# Patient Record
Sex: Male | Born: 1980 | Hispanic: Yes | Marital: Married | Smoking: Never smoker
Health system: Southern US, Community
[De-identification: ages and names within clinical notes are randomized; demographics above are authoritative.]

## PROBLEM LIST (undated history)

## (undated) DIAGNOSIS — K219 Gastro-esophageal reflux disease without esophagitis: Secondary | ICD-10-CM

## (undated) DIAGNOSIS — Z87828 Personal history of other (healed) physical injury and trauma: Secondary | ICD-10-CM

## (undated) DIAGNOSIS — E785 Hyperlipidemia, unspecified: Secondary | ICD-10-CM

## (undated) DIAGNOSIS — R7989 Other specified abnormal findings of blood chemistry: Secondary | ICD-10-CM

## (undated) DIAGNOSIS — E221 Hyperprolactinemia: Secondary | ICD-10-CM

## (undated) HISTORY — PX: WRIST SURGERY: SHX841

## (undated) HISTORY — DX: Hyperprolactinemia: E22.1

## (undated) HISTORY — DX: Hyperlipidemia, unspecified: E78.5

## (undated) HISTORY — DX: Personal history of other (healed) physical injury and trauma: Z87.828

## (undated) HISTORY — DX: Other specified abnormal findings of blood chemistry: R79.89

---

## 2013-10-14 ENCOUNTER — Ambulatory Visit: Payer: Self-pay

## 2013-10-14 ENCOUNTER — Ambulatory Visit: Payer: Self-pay | Admitting: Family Medicine

## 2013-10-14 VITALS — BP 124/90 | HR 75 | Temp 98.5°F | Resp 18 | Ht 66.0 in | Wt 144.0 lb

## 2013-10-14 DIAGNOSIS — R1031 Right lower quadrant pain: Secondary | ICD-10-CM

## 2013-10-14 DIAGNOSIS — K5900 Constipation, unspecified: Secondary | ICD-10-CM

## 2013-10-14 DIAGNOSIS — N50811 Right testicular pain: Secondary | ICD-10-CM

## 2013-10-14 DIAGNOSIS — N509 Disorder of male genital organs, unspecified: Secondary | ICD-10-CM

## 2013-10-14 LAB — POCT UA - MICROSCOPIC ONLY
BACTERIA, U MICROSCOPIC: NEGATIVE
CASTS, UR, LPF, POC: NEGATIVE
CRYSTALS, UR, HPF, POC: NEGATIVE
Epithelial cells, urine per micros: NEGATIVE
RBC, urine, microscopic: NEGATIVE
YEAST UA: NEGATIVE

## 2013-10-14 LAB — POCT CBC
Granulocyte percent: 62.5 %G (ref 37–80)
HCT, POC: 46.8 % (ref 43.5–53.7)
HEMOGLOBIN: 14.7 g/dL (ref 14.1–18.1)
Lymph, poc: 1.5 (ref 0.6–3.4)
MCH: 27.8 pg (ref 27–31.2)
MCHC: 31.4 g/dL — AB (ref 31.8–35.4)
MCV: 88.7 fL (ref 80–97)
MID (CBC): 0.4 (ref 0–0.9)
MPV: 9 fL (ref 0–99.8)
POC Granulocyte: 3.1 (ref 2–6.9)
POC LYMPH PERCENT: 30 %L (ref 10–50)
POC MID %: 7.5 %M (ref 0–12)
Platelet Count, POC: 229 10*3/uL (ref 142–424)
RBC: 5.28 M/uL (ref 4.69–6.13)
RDW, POC: 14.1 %
WBC: 4.9 10*3/uL (ref 4.6–10.2)

## 2013-10-14 LAB — POCT URINALYSIS DIPSTICK
Bilirubin, UA: NEGATIVE
GLUCOSE UA: NEGATIVE
Ketones, UA: NEGATIVE
Leukocytes, UA: NEGATIVE
NITRITE UA: NEGATIVE
RBC UA: NEGATIVE
UROBILINOGEN UA: 0.2
pH, UA: 5.5

## 2013-10-14 MED ORDER — TRAMADOL HCL 50 MG PO TABS: 50.0000 mg | ORAL_TABLET | Freq: Three times a day (TID) | ORAL | Status: DC | PRN

## 2013-10-14 NOTE — Progress Notes (Signed)
Subjective: For 3 months patient been having pain in his right testicle. A chest pain in the right lower quadrant of the abdomen it radiates down into the right testicle. 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 Knows of no specific injuries to his abdomen or testicle. He is Spanish-speaking but does very adequate in [place]. No nausea or vomiting. No urinary problems. No major pain with intercourse.  Objective: Healthy-appearing young man in no major distress. No CVA tenderness. Abdomen soft, normal bowel sounds, tender lower quadrant and about the McBurney's point area. Normal male scrotum with testes descended no tenderness. Negative for hernias.  Assessment: Right testicular pain Right lower quadrant abdominal pain  Plan: KUB, urinalysis, CBC  [facility] reading (PRIMARY) by  Dr. Linna Darner Normal xray except for a lot of stool in the colon  Results for orders placed in visit on 10/14/13  POCT CBC      Result Value Range   WBC 4.9  4.6 - 10.2 K/uL   Lymph, poc 1.5  0.6 - 3.4   POC LYMPH PERCENT 30.0  10 - 50 %L   MID (cbc) 0.4  0 - 0.9   POC MID % 7.5  0 - 12 %M   POC Granulocyte 3.1  2 - 6.9   Granulocyte percent 62.5  37 - 80 %G   RBC 5.28  4.69 - 6.13 M/uL   Hemoglobin 14.7  14.1 - 18.1 g/dL   HCT, POC 46.8  43.5 - 53.7 %   MCV 88.7  80 - 97 fL   MCH, POC 27.8  27 - 31.2 pg   MCHC 31.4 (*) 31.8 - 35.4 g/dL   RDW, POC 14.1     Platelet Count, POC 229  142 - 424 K/uL   MPV 9.0  0 - 99.8 fL  POCT UA - MICROSCOPIC ONLY      Result Value Range   WBC, Ur, HPF, POC 0-1     RBC, urine, microscopic neg     Bacteria, U Microscopic neg     Mucus, UA trace     Epithelial cells, urine per micros neg     Crystals, Ur, HPF, POC neg     Casts, Ur, LPF, POC neg     Yeast, UA neg    POCT URINALYSIS DIPSTICK      Result Value Range   Color, UA yellow     Clarity, UA clear     Glucose, UA neg     Bilirubin, UA neg     Ketones, UA neg     Spec Grav, UA >=1.030     Blood, UA neg     pH, UA 5.5     Protein, UA trace     Urobilinogen, UA 0.2     Nitrite, UA neg     Leukocytes, UA Negative     With normal labs and a lot of stool in the colon on the x-ray I do not find any specific cause. I think we will treat him with some laxatives and see how he does.  Fab helped explained things to patient in Spanish.  Marland Kitchen

## 2013-10-14 NOTE — Patient Instructions (Signed)
Drink plenty of fluids, especially water and juice like grape juice or apple juice or prune juice  Take MiraLax one dose daily until stools are loose, then decrease to every other day or as needed  If pain continues to persist please return and we will reevaluate and we may need to refer you to a urology specialist to evaluate the pain into the testicle  Return at any time if worse  Take one tramadol if needed for bad pain

## 2014-12-14 ENCOUNTER — Emergency Department
Admission: EM | Admit: 2014-12-14 | Discharge: 2014-12-14 | Disposition: A | Discharge status: Home / Self Care | Payer: Self-pay | Source: Home / Self Care | Attending: Emergency Medicine | Admitting: Emergency Medicine

## 2014-12-14 ENCOUNTER — Encounter: Payer: Self-pay | Admitting: *Deleted

## 2014-12-14 DIAGNOSIS — N342 Other urethritis: Secondary | ICD-10-CM

## 2014-12-14 DIAGNOSIS — N451 Epididymitis: Secondary | ICD-10-CM

## 2014-12-14 LAB — POCT URINALYSIS DIP (MANUAL ENTRY)
Bilirubin, UA: NEGATIVE
Blood, UA: NEGATIVE
Glucose, UA: NEGATIVE
Ketones, POC UA: NEGATIVE
Leukocytes, UA: NEGATIVE
Nitrite, UA: NEGATIVE
Protein Ur, POC: NEGATIVE
Spec Grav, UA: 1.025 (ref 1.005–1.03)
Urobilinogen, UA: 0.2 (ref 0–1)
pH, UA: 6 (ref 5–8)

## 2014-12-14 MED ORDER — METRONIDAZOLE 500 MG PO TABS: 500.0000 mg | ORAL_TABLET | Freq: Two times a day (BID) | ORAL | Status: DC

## 2014-12-14 MED ORDER — DOXYCYCLINE HYCLATE 100 MG PO CAPS: 100.0000 mg | ORAL_CAPSULE | Freq: Two times a day (BID) | ORAL | Status: DC

## 2014-12-14 MED ORDER — CEFTRIAXONE SODIUM 250 MG IJ SOLR
500.0000 mg | Freq: Once | INTRAMUSCULAR | Status: AC
  Administered 2014-12-14: 500 mg via INTRAMUSCULAR

## 2014-12-14 NOTE — ED Notes (Signed)
Pt c/o RLQ abd pain and RT testicle pain x 1 year.

## 2014-12-14 NOTE — ED Provider Notes (Signed)
CSN: [id number]     Arrival date & time 12/14/14  1948 History   First MD Initiated Contact with Patient 12/14/14 1950     Chief Complaint  Patient presents with  . Abdominal Pain   Presents to [facility] urgent care 7:45 PM HPI Here with fianc.. One year of mild right testicular pain. Nonspecific Chief complaint:  2 weeks of worsening right testicular pain with minimal swelling. Some dull mild suprapubic and right lower quadrant tenderness and pain. No nausea or vomiting. Appetite normal. No change of bowel habits. Also complains of reddened irritation of gums for several weeks without any bleeding or drainage. No sore throat. No other ENT symptoms. Complains of dysuria and urethral discharge for 2 weeks. No blood in urine. He states he is monogamous with his fiance. Denies ever having had an STD. He states last STD testing was about 2 years ago and was negative. He states he does not have a PCP. He speaks English fairly, but his fiance helps interpret for him at his request. Denies chest pain or shortness of breath or back pain. His fiance is being evaluated for possible STD as she has acute pelvic pain and vaginal discharge. History reviewed. No pertinent past medical history. History reviewed. No pertinent past surgical history. Family History  Problem Relation Age of Onset  . Hypertension Mother   . Diabetes Mother    History  Substance Use Topics  . Smoking status: Never Smoker   . Smokeless tobacco: Not on file  . Alcohol Use: No    Review of Systems  All other systems reviewed and are negative.   Allergies  Review of patient's allergies indicates no known allergies.  Home Medications   Prior to Admission medications   Medication Sig Start Date End Date Taking? Authorizing Provider  doxycycline (VIBRAMYCIN) 100 MG capsule Take 1 capsule (100 mg total) by mouth 2 (two) times daily. 12/14/14   Jacqulyn Cane, MD  metroNIDAZOLE (FLAGYL) 500 MG tablet Take 1 tablet  (500 mg total) by mouth 2 (two) times daily. For 10 days 12/14/14   Jacqulyn Cane, MD   BP 113/89 mmHg  Pulse 61  Temp(Src) 98.2 F (36.8 C) (Oral)  Resp 16  Ht 5\' 6"  (1.676 m)  Wt 160 lb (72.576 kg)  BMI 25.84 kg/m2  SpO2 99% Physical Exam  Constitutional: He is oriented to person, place, and time. He appears well-developed and well-nourished. No distress.  HENT:  Head: Normocephalic and atraumatic.  Some inflamed gingiva diffusely, but no evidence of dental abscess. No tenderness to percussion over teeth. No other oral lesions.  Eyes: Conjunctivae and EOM are normal. Pupils are equal, round, and reactive to light. No scleral icterus.  Neck: Normal range of motion.  Cardiovascular: Normal rate and normal heart sounds.   No murmur heard. Pulmonary/Chest: Effort normal and breath sounds normal. No respiratory distress.  Abdominal: Soft. He exhibits no distension and no mass. There is no hepatosplenomegaly. There is no rigidity, no rebound, no guarding, no CVA tenderness, no tenderness at McBurney's point and negative Murphy's sign. No hernia. Hernia confirmed negative in the right inguinal area and confirmed negative in the left inguinal area.  Very mild suprapubic and right lower quadrant tenderness.  Genitourinary: Left testis shows no swelling and no tenderness.  Right epididymis is mildly swollen and mildly indurated and tender.  Penis without lesions seen. There is a scant clearish yellow urethral discharge  Musculoskeletal: Normal range of motion.  Lymphadenopathy:    He has  no cervical adenopathy.       Right: No inguinal adenopathy present.       Left: No inguinal adenopathy present.  Neurological: He is alert and oriented to person, place, and time.  Skin: Skin is warm.  Psychiatric: He has a normal mood and affect.  Nursing note and vitals reviewed.  He declined rectal/prostate exam today.. ED Course  Procedures (including critical care time) Labs Review Labs Reviewed   GC/CHLAMYDIA PROBE AMP, URINE  HIV ANTIBODY (ROUTINE TESTING)  RPR  POCT URINALYSIS DIP (MANUAL ENTRY)   Results for orders placed or performed during the hospital encounter of 12/14/14  POCT urinalysis dipstick (new)  Result Value Ref Range   Color, UA yellow    Clarity, UA clear    Glucose, UA neg    Bilirubin, UA negative    Bilirubin, UA negative    Spec Grav, UA 1.025 1.005 - 1.03   Blood, UA negative    pH, UA 6.0 5 - 8   Protein Ur, POC negative    Urobilinogen, UA 0.2 0 - 1   Nitrite, UA Negative    Leukocytes, UA Negative      MDM   1. Urethritis   2. Right epididymitis    No evidence for acute abdomen. Based on the history and physical exam, he needs coverage for possible chlamydia, GC, and possible trichomonas. Treatment options discussed, as well as risks, benefits, alternatives. Patient voiced understanding and agreement with the following plans: Urine for GC and chlamydia. Blood test drawn for HIV and RPR. Rocephin 500 mg IM stat Discharge Medication List as of 12/14/2014  8:56 PM    START taking these medications   Details  doxycycline (VIBRAMYCIN) 100 MG capsule Take 1 capsule (100 mg total) by mouth 2 (two) times daily., Starting 12/14/2014, Until Discontinued, Normal    metroNIDAZOLE (FLAGYL) 500 MG tablet Take 1 tablet (500 mg total) by mouth 2 (two) times daily. For 10 days, Starting 12/14/2014, Until Discontinued, Normal       I urged him to establish with and follow up with primary care doctor within 7 days.  Precautions discussed. Red flags discussed.--Emergency room if any red flag Questions invited and answered. Patient voiced understanding and agreement.     Jacqulyn Cane, MD 12/14/14 2125

## 2014-12-15 LAB — GC/CHLAMYDIA PROBE AMP, URINE
Chlamydia, Swab/Urine, PCR: NEGATIVE
GC Probe Amp, Urine: NEGATIVE

## 2014-12-15 LAB — RPR

## 2014-12-15 LAB — HIV ANTIBODY (ROUTINE TESTING W REFLEX): HIV 1&2 Ab, 4th Generation: NONREACTIVE

## 2014-12-16 ENCOUNTER — Telehealth: Payer: Self-pay | Admitting: Emergency Medicine

## 2016-12-04 ENCOUNTER — Ambulatory Visit: Payer: Self-pay | Admitting: Physician Assistant

## 2017-07-03 ENCOUNTER — Ambulatory Visit (INDEPENDENT_AMBULATORY_CARE_PROVIDER_SITE_OTHER): Payer: Self-pay | Admitting: Physician Assistant

## 2017-07-03 ENCOUNTER — Encounter: Payer: Self-pay | Admitting: Physician Assistant

## 2017-07-03 VITALS — BP 115/80 | HR 56 | Resp 14 | Ht 66.0 in | Wt 157.0 lb

## 2017-07-03 DIAGNOSIS — R7989 Other specified abnormal findings of blood chemistry: Secondary | ICD-10-CM

## 2017-07-03 DIAGNOSIS — R6882 Decreased libido: Secondary | ICD-10-CM

## 2017-07-03 DIAGNOSIS — M25512 Pain in left shoulder: Secondary | ICD-10-CM

## 2017-07-03 DIAGNOSIS — R5383 Other fatigue: Secondary | ICD-10-CM

## 2017-07-03 DIAGNOSIS — Z7689 Persons encountering health services in other specified circumstances: Secondary | ICD-10-CM

## 2017-07-03 DIAGNOSIS — Z23 Encounter for immunization: Secondary | ICD-10-CM

## 2017-07-03 DIAGNOSIS — M25511 Pain in right shoulder: Secondary | ICD-10-CM

## 2017-07-03 DIAGNOSIS — M7711 Lateral epicondylitis, right elbow: Secondary | ICD-10-CM

## 2017-07-03 DIAGNOSIS — E221 Hyperprolactinemia: Secondary | ICD-10-CM

## 2017-07-03 DIAGNOSIS — Z833 Family history of diabetes mellitus: Secondary | ICD-10-CM

## 2017-07-03 DIAGNOSIS — R531 Weakness: Secondary | ICD-10-CM

## 2017-07-03 MED ORDER — DICLOFENAC SODIUM 1 % TD GEL: 2.0000 g | Freq: Four times a day (QID) | TRANSDERMAL | 11 refills | Status: AC

## 2017-07-03 NOTE — Patient Instructions (Signed)
Fatiga (Fatigue) La fatiga es una sensacin de cansancio en todo momento, falta de energa o falta de motivacin. La fatiga ocasional o leve con frecuencia es una reaccin normal a la actividad o la vida en general. Sin embargo, la fatiga de Engineer, site duracin (crnica) o extrema puede indicar una enfermedad preexistente. INSTRUCCIONES PARA EL CUIDADO EN EL HOGAR Controle su fatiga para ver si hay cambios. Las siguientes indicaciones ayudarn a Writer Ryder System pueda sentir:  Hable con el mdico acerca de cunto debe dormir cada noche. Trate de dormir la cantidad de tiempo requerida todas las noches.  Tome los medicamentos solamente como se lo haya indicado el mdico.  Siga una dieta saludable y nutritiva. Pida ayuda al mdico si necesita hacer cambios en su dieta.  Beba suficiente lquido para Consulting civil engineer orina clara o de color amarillo plido.  Practique actividades que lo relajen, como yoga, meditacin, terapia de [place] o acupuntura.  Haga ejercicios regularmente.  Cambie las situaciones que le provocan estrs. Trate de que su [place] y personal sea moderada.  No consuma drogas.  Limite el consumo de alcohol a no ms de 1 medida por da si es mujer y no est Music therapist, y 2 medidas si es hombre. Una medida equivale a 12onzas de cerveza, 5onzas de vino o 1onzas de bebidas alcohlicas de alta graduacin.  Tome una multivitamina, si se lo indic el mdico. SOLICITE ATENCIN MDICA SI:  La fatiga no mejora.  Tiene fiebre.  Tiene prdida o aumento involuntario de [place].  Tiene dolores de [place].  Tiene dificultad para: ? Dormirse. ? Dormir durante toda la noche.  Se siente enojado, culpable, ansioso o triste.  No puede defecar (estreimiento).  Tiene la piel seca.  Tiene hinchadas las piernas u otra parte del cuerpo. SOLICITE ATENCIN MDICA DE INMEDIATO SI:  Se siente confundido.  Tiene visin borrosa.  Sufre mareos o se desmaya.  Sufre un  dolor intenso de [place].  Siente dolor intenso en el abdomen, la pelvis o la espalda.  Tiene dolor de pecho, dificultad para respirar, o latidos cardacos irregulares o acelerados.  No puede orinar u [employer] de lo normal.  Presenta sangrado anormal, como sangrado del recto, la vagina, la nariz, los pulmones o los pezones.  Vomita sangre.  Tiene pensamientos acerca de hacerse dao a s mismo o cometer suicidio.  Le preocupa la posibilidad de hacerle dao a otra persona. Esta informacin no tiene Marine scientist el consejo del mdico. Asegrese de hacerle al mdico cualquier pregunta que tenga. Document Released: 12/27/2008 Document Revised: 01/02/2016 Document Reviewed: 01/12/2014   Codo de [employer] (Tennis Elbow) El codo de tenista es la hinchazn (inflamacin) de los tendones externos del antebrazo cerca del codo. Los tendones [employer] con los [place]. Esta afeccin puede presentarse si practica un deporte o realiza una tarea que exige el uso excesivo del codo. La causa del codo de [place] es la repeticin del mismo movimiento una y Elmon Kirschner. El codo de [place] puede causar lo siguiente:  Dolor y sensibilidad a la palpacin en el Management consultant y la parte externa del codo.  Sensacin de ardor que se extiende desde el codo Loss adjuster, chartered.  Agarre dbil de las manos. CUIDADOS EN EL HOGAR Actividad  Ponga el codo y Advertising copywriter en reposo como se lo haya indicado el mdico. Intente no realizar ninguna actividad que pueda haber causado el problema hasta que el mdico le permita reanudarla.  Si un fisioterapeuta le ensea ejercicios,  hgalos como se lo hayan indicado.  Si levanta un objeto, hgalo con la palma de la mano [place] arriba. [employer], se reduce el esfuerzo en el codo. Estilo de vida  Si el codo de [place] se debe a los deportes, revise el equipo y Chief Strategy Officer de lo siguiente: ? Que lo [place] correctamente. ? Que es apto para usted.  Si el codo de [place] se debe  al [place], tmese descansos con frecuencia, si es posible. Hable con el gerente respecto de hacer su trabajo de un modo que sea seguro para usted. ? Si el codo de [place] se debe al uso de la computadora, hable con el gerente [employer] cambios que pueden hacerse en su lugar de [place]. Instrucciones generales  Si se lo indican, aplique hielo sobre la zona dolorida: ? Field seismologist hielo en una bolsa plstica. ? Coloque una [employer] piel y la bolsa de hielo. ? Coloque el hielo durante 86minutos, 2 a 3veces por da.  Tome los medicamentos solamente como se lo haya indicado el mdico.  Si le aconsejaron usar un dispositivo ortopdico, selo como se lo haya indicado el mdico.  Concurra a todas las visitas de control como se lo haya indicado el mdico. Esto es importante. SOLICITE AYUDA SI:  El dolor no mejora con Dispensing optician.  El dolor [place].  Tiene debilidad en el antebrazo, la mano o los dedos de la [place].  No puede sentir el antebrazo, la mano o los dedos de la [place]. Esta informacin no tiene Marine scientist el consejo del mdico. Asegrese de hacerle al mdico cualquier pregunta que tenga. Document Released: 10/13/2010 Document Revised: 01/25/2015 Document Reviewed: 09/06/2014 Elsevier Interactive Patient Education  2018 [employer].  Chartered certified accountant Patient Education  [employer].

## 2017-07-03 NOTE — Progress Notes (Signed)
HPI:                                                                Blake Thornton is a 36 y.o. male who presents to [place]: Primary Care Sports Medicine today to establish care  Current concerns include: weakness and fatigue  Patient is Spanish speaking. His wife is acting as Astronomer. He has signed a waiver to a licensed interpreter.  Patient presents today with gradually worsening generalized weakness and fatigue. Onset 6 months ago, gradual. No precipitating events. 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 He denies fever, chills, unintended weight loss, cough, abdominal pain, change in bowel habits. Does endorse nausea, headaches, polydipsia, polyuria. Denies recent travel.   Past Medical History:  Diagnosis Date  . Hyperlipidemia    Past Surgical History:  Procedure Laterality Date  . WRIST SURGERY     Social History  Substance Use Topics  . Smoking status: Never Smoker  . Smokeless tobacco: Never Used  . Alcohol use No   family history includes Alcohol abuse in his father; Diabetes in his mother, sister, and sister; Hypertension in his mother, sister, and sister.  ROS: Review of Systems  Constitutional: Positive for diaphoresis. Negative for weight loss.  HENT: Negative.   Eyes: Positive for blurred vision.  Respiratory: Negative for cough and hemoptysis.   Cardiovascular: Positive for chest pain.  Gastrointestinal: Positive for nausea. Negative for blood in stool, constipation, diarrhea and melena.  Genitourinary: Negative for dysuria.       + nocturia  Musculoskeletal: Positive for joint pain (right elbow, bilateral shoulders) and myalgias.  Skin: Negative.   Neurological: Positive for weakness and headaches. Negative for dizziness, focal weakness and loss of consciousness.  Endo/Heme/Allergies: Positive for polydipsia.  Psychiatric/Behavioral: Negative for depression. The patient has insomnia.       Medications: Current Outpatient Prescriptions  Medication Sig Dispense Refill  . meloxicam (MOBIC) 7.5 MG tablet Take 7.5 mg by mouth daily.    Marland Kitchen lovastatin (MEVACOR) 10 MG tablet Take 10 mg by mouth daily.  2   No current facility-administered medications for this visit.    No Known Allergies   Objective:  BP 115/80   Pulse (!) 56   Resp 14   Ht 5\' 6"  (1.676 m)   Wt 157 lb (71.2 kg)   BMI 25.34 kg/m  Gen: well-groomed, cooperative, not ill-appearing, no distress HEENT: conjunctiva and cornea clear, EOMs intact, oropharynx clear, moist mucus membranes, neck supple, trachea midline, no thyromegaly or tenderness Pulm: Normal work of breathing, normal phonation, clear to auscultation bilaterally, no wheezes, rales or rhonchi CV: bradycardic, regular rhythm, s1 and s2 distinct, no murmurs, clicks or rubs  GI: abdomen soft, nondistended, nontender, no masses, no organomegaly Neuro: alert and oriented x 3, no tremor MSK: extremities atraumatic, normal gait and station; Shoulders: atraumatic, no visible deformity, no a/c joint tenderness, positive Hawkin's and lift-off bilaterally, full active ROM; Right elbow: atraumatic, no visible deformity, tenderness over the lateral epicondyle, strength and ROM intact Skin: warm, dry, intact; no rashes on exposed skin, no cyanosis  No flowsheet data found.   No results found for this or any previous visit (from the past 72 hour(s)). No results found.  Assessment and Plan: 36 y.o. male with   1. Encounter to establish care - reviewed PMH, PSH, PFH, medications and allergies - reviewed health maintenance - influenza given today  2. Family history of diabetes mellitus - Hemoglobin A1c  3. Fatigue, unspecified type - patient has some symptoms consistent with hypogonadism. Checking morning serum testosterone as well as assessing for thyroid disease, anemia, iron deficiency, and vitamin deficiencies - CBC - Comprehensive metabolic  panel - C-reactive protein - Ferritin - Sedimentation rate - TSH - Vitamin B12 - Vit D  25 hydroxy (rtn osteoporosis monitoring)  4. Lateral epicondylitis of right elbow - rehab exercises, topical anti-inflammatory, follow-up with Sports Medicine in 1-2 weeks - diclofenac sodium (VOLTAREN) 1 % GEL; Apply 2 g topically 4 (four) times daily. To affected joint.  Dispense: 100 g; Refill: 11  5. Acute pain of both shoulders   6. Low libido - Testosterone  7. Weakness generalized - Testosterone  8. Need for immunization against influenza - Flu Vaccine QUAD 36+ mos IM    Patient education and anticipatory guidance given Patient agrees with treatment plan Follow-up in 2 weeks or sooner as needed if symptoms worsen or fail to improve  Darlyne Russian PA-C

## 2017-07-04 ENCOUNTER — Ambulatory Visit (INDEPENDENT_AMBULATORY_CARE_PROVIDER_SITE_OTHER): Payer: Self-pay | Admitting: Sports Medicine

## 2017-07-04 ENCOUNTER — Encounter: Payer: Self-pay | Admitting: Sports Medicine

## 2017-07-04 ENCOUNTER — Ambulatory Visit (INDEPENDENT_AMBULATORY_CARE_PROVIDER_SITE_OTHER): Payer: Self-pay

## 2017-07-04 DIAGNOSIS — M25519 Pain in unspecified shoulder: Secondary | ICD-10-CM

## 2017-07-04 DIAGNOSIS — M542 Cervicalgia: Secondary | ICD-10-CM

## 2017-07-04 DIAGNOSIS — M25512 Pain in left shoulder: Secondary | ICD-10-CM

## 2017-07-04 DIAGNOSIS — M19011 Primary osteoarthritis, right shoulder: Secondary | ICD-10-CM | POA: Insufficient documentation

## 2017-07-04 DIAGNOSIS — M25511 Pain in right shoulder: Secondary | ICD-10-CM

## 2017-07-04 DIAGNOSIS — M19012 Primary osteoarthritis, left shoulder: Secondary | ICD-10-CM

## 2017-07-04 MED ORDER — PREDNISONE 50 MG PO TABS: ORAL_TABLET | ORAL | 0 refills | Status: DC

## 2017-07-04 MED ORDER — MELOXICAM 15 MG PO TABS: ORAL_TABLET | ORAL | 3 refills | Status: DC

## 2017-07-04 NOTE — Progress Notes (Signed)
   Subjective:    I'm seeing this patient as a consultation for:  Nelson Chimes, PA-C  CC: neck and shoulder pain  HPI: This is a pleasant and healthy 36 year old male, he works as a Theme park manager. For the past 2-3 months he's had pain that he localizes from his neck, radiating over the trapezius to the deltoids and all bleeding down both arms to his second and third fingers. He does describe numbness and tingling in his fingers. Pain is moderate, persistent.  Past medical history, Surgical history, Family history not pertinant except as noted below, Social history, Allergies, and medications have been entered into the medical record, reviewed, and no changes needed.   Review of Systems: No headache, visual changes, nausea, vomiting, diarrhea, constipation, dizziness, abdominal pain, skin rash, fevers, chills, night sweats, weight loss, swollen lymph nodes, body aches, joint swelling, muscle aches, chest pain, shortness of breath, mood changes, visual or auditory hallucinations.   Objective:   General: Well Developed, well nourished, and in no acute distress.  Neuro:  Extra-ocular muscles intact, able to move all 4 extremities, sensation grossly intact.  Deep tendon reflexes tested were normal. Psych: Alert and oriented, mood congruent with affect. ENT:  Ears and nose appear unremarkable.  Hearing grossly normal. Neck: Unremarkable overall appearance, trachea midline.  No visible thyroid enlargement. Eyes: Conjunctivae and lids appear unremarkable.  Pupils equal and round. Skin: Warm and dry, no rashes noted.  Cardiovascular: Pulses palpable, no extremity edema. Neck: Negative spurling's Full neck range of motion Grip strength and sensation normal in bilateral hands Strength good C4 to T1 distribution No sensory change to C4 to T1 Reflexes normal Bilateral shoulders: Inspection reveals no abnormalities, atrophy or asymmetry. Palpation is normal with no tenderness over AC joint or  bicipital groove. ROM is full in all planes. Rotator cuff strength normal throughout. Minimally positive Neer and Hawkin's tests, empty can. Speeds and Yergason's tests normal. No labral pathology noted with negative Obrien's, negative crank, negative clunk, and good stability. Normal scapular function observed. No painful arc and no drop arm sign. No apprehension sign  Impression and Recommendations:   This case required medical decision making of moderate complexity.  Neck and shoulder pain This does represent the great enigma of sports medicine, chicken or the egg, neck or shoulder. I do think this is cervical radiculitis, C7. Starting prednisone, x-rays of the neck and shoulders, meloxicam, formal physical therapy. Return to see me in 4-6 weeks.  ___________________________________________ Gwen Her. Dianah Field, M.D., ABFM., CAQSM. Primary Care and [place] Instructor of [place] [facility] of Medicine

## 2017-07-04 NOTE — Assessment & Plan Note (Signed)
This does represent the great enigma of sports medicine, chicken or the egg, neck or shoulder. I do think this is cervical radiculitis, C7. Starting prednisone, x-rays of the neck and shoulders, meloxicam, formal physical therapy. Return to see me in 4-6 weeks.

## 2017-07-05 DIAGNOSIS — R5383 Other fatigue: Secondary | ICD-10-CM | POA: Insufficient documentation

## 2017-07-05 DIAGNOSIS — R6882 Decreased libido: Secondary | ICD-10-CM | POA: Insufficient documentation

## 2017-07-05 DIAGNOSIS — M7711 Lateral epicondylitis, right elbow: Secondary | ICD-10-CM | POA: Insufficient documentation

## 2017-07-05 DIAGNOSIS — R531 Weakness: Secondary | ICD-10-CM | POA: Insufficient documentation

## 2017-07-05 DIAGNOSIS — Z833 Family history of diabetes mellitus: Secondary | ICD-10-CM | POA: Insufficient documentation

## 2017-07-05 LAB — COMPREHENSIVE METABOLIC PANEL
AG Ratio: 1.4 (calc) (ref 1.0–2.5)
ALBUMIN MSPROF: 4.2 g/dL (ref 3.6–5.1)
ALKALINE PHOSPHATASE (APISO): 70 U/L (ref 40–115)
ALT: 18 U/L (ref 9–46)
AST: 15 U/L (ref 10–40)
BUN: 20 mg/dL (ref 7–25)
CO2: 26 mmol/L (ref 20–32)
CREATININE: 0.7 mg/dL (ref 0.60–1.35)
Calcium: 8.9 mg/dL (ref 8.6–10.3)
Chloride: 105 mmol/L (ref 98–110)
Globulin: 3.1 g/dL (calc) (ref 1.9–3.7)
Glucose, Bld: 102 mg/dL — ABNORMAL HIGH (ref 65–99)
Potassium: 4.2 mmol/L (ref 3.5–5.3)
Sodium: 138 mmol/L (ref 135–146)
Total Bilirubin: 0.6 mg/dL (ref 0.2–1.2)
Total Protein: 7.3 g/dL (ref 6.1–8.1)

## 2017-07-05 LAB — CBC
HCT: 39.2 % (ref 38.5–50.0)
Hemoglobin: 13.3 g/dL (ref 13.2–17.1)
MCH: 27.9 pg (ref 27.0–33.0)
MCHC: 33.9 g/dL (ref 32.0–36.0)
MCV: 82.2 fL (ref 80.0–100.0)
MPV: 10.7 fL (ref 7.5–12.5)
PLATELETS: 229 10*3/uL (ref 140–400)
RBC: 4.77 10*6/uL (ref 4.20–5.80)
RDW: 13.3 % (ref 11.0–15.0)
WBC: 5.8 10*3/uL (ref 3.8–10.8)

## 2017-07-05 LAB — VITAMIN D 25 HYDROXY (VIT D DEFICIENCY, FRACTURES): Vit D, 25-Hydroxy: 28 ng/mL — ABNORMAL LOW (ref 30–100)

## 2017-07-05 LAB — HEMOGLOBIN A1C
EAG (MMOL/L): 6.6 (calc)
Hgb A1c MFr Bld: 5.8 % of total Hgb — ABNORMAL HIGH (ref <5.7)
Mean Plasma Glucose: 120 (calc)

## 2017-07-05 LAB — VITAMIN B12: Vitamin B-12: 388 pg/mL (ref 200–1100)

## 2017-07-05 LAB — FERRITIN: FERRITIN: 17 ng/mL — AB (ref 20–345)

## 2017-07-05 LAB — TSH: TSH: 1.27 mIU/L (ref 0.40–4.50)

## 2017-07-05 LAB — C-REACTIVE PROTEIN: CRP: 0.7 mg/L (ref <8.0)

## 2017-07-05 LAB — SEDIMENTATION RATE: SED RATE: 6 mm/h (ref 0–15)

## 2017-07-05 LAB — TESTOSTERONE: TESTOSTERONE: 213 ng/dL — AB (ref 250–827)

## 2017-07-05 NOTE — Progress Notes (Signed)
Good morning,  Your labs show a few findings that could explain your fatigue and weakness 1. Testosterone is low. This needs to be confirmed with another 8am blood draw. Testosterone can be replaced with an injection or topical gel 2. Iron stores (ferritin) are a little low. Recommend increasing iron-fortified and iron-rich foods in the diet 3. Vitamin D is also a little low. Recommend starting a daily vitamin D3 supplement 2000 units  Also, A1c is in the prediabetic range. This increases risk of developing diabetes. Recommend low carb diet. We need to monitor this every 6 months.  I will order your blood work to confirm the low testosterone diagnosis and would like you to schedule a follow-up appointment for 2-3 days after your labs are drawn.  Best, Evlyn Clines

## 2017-07-10 ENCOUNTER — Encounter: Payer: Self-pay | Admitting: Physician Assistant

## 2017-07-10 DIAGNOSIS — E221 Hyperprolactinemia: Secondary | ICD-10-CM | POA: Insufficient documentation

## 2017-07-10 DIAGNOSIS — R7989 Other specified abnormal findings of blood chemistry: Secondary | ICD-10-CM

## 2017-07-10 HISTORY — DX: Other specified abnormal findings of blood chemistry: R79.89

## 2017-07-10 HISTORY — DX: Hyperprolactinemia: E22.1

## 2017-07-10 LAB — TESTOSTERONE TOTAL,FREE,BIO, MALES
ALBUMIN MSPROF: 4.4 g/dL (ref 3.6–5.1)
Sex Hormone Binding: 10 nmol/L (ref 10–50)
Testosterone: 118 ng/dL — ABNORMAL LOW (ref 250–827)

## 2017-07-10 LAB — TEST AUTHORIZATION

## 2017-07-10 LAB — FSH/LH
FSH: 5.1 m[IU]/mL (ref 1.6–8.0)
LH: 2.4 m[IU]/mL (ref 1.5–9.3)

## 2017-07-10 LAB — PROLACTIN: Prolactin: 45.7 ng/mL — ABNORMAL HIGH (ref 2.0–18.0)

## 2017-07-10 LAB — PSA: PSA: 0.5 ng/mL (ref <=4.0)

## 2017-07-10 NOTE — Progress Notes (Signed)
Good morning,  Labs confirm low testosterone, also known as hypogonadism. This is an endocrine disorder that can have multiple causes. There is a gland inside the head called the pituitary gland and sometimes this is the source of the problem. To determine the cause, we need to get a brain MRI. You will be contacted soon to schedule this.   If you have any questions or concerns, I would be happy to discuss this with you in person.   Best, Evlyn Clines

## 2017-07-17 ENCOUNTER — Encounter: Payer: Self-pay | Admitting: Physician Assistant

## 2017-07-17 ENCOUNTER — Ambulatory Visit (INDEPENDENT_AMBULATORY_CARE_PROVIDER_SITE_OTHER): Payer: Self-pay | Admitting: Physician Assistant

## 2017-07-17 VITALS — BP 111/73 | HR 60 | Wt 152.0 lb

## 2017-07-17 DIAGNOSIS — E221 Hyperprolactinemia: Secondary | ICD-10-CM

## 2017-07-17 DIAGNOSIS — R7989 Other specified abnormal findings of blood chemistry: Secondary | ICD-10-CM

## 2017-07-17 DIAGNOSIS — N469 Male infertility, unspecified: Secondary | ICD-10-CM | POA: Insufficient documentation

## 2017-07-17 NOTE — Progress Notes (Signed)
HPI:                                                                Blake Thornton is a 36 y.o. male who presents to [facility] today for follow-up of fatigue/abnormal labs  Patient is Spanish speaking. Patient's wife is acting as Astronomer. He has signed the waiver declining a certified interpreter.  Patient presents today for follow-up of chronic fatigue, weakness, low libido and to review recent lab results. Blake Thornton was found to have very low serum testosterone (027,253) with normal FSH, LH and high prolactin (45.7). Patient's wife reports they have been trying to achieve pregnancy for 6 months without success. Wife also reports erectile dysfunction. She has had 3 pregnancies and 3 live births with a different partner in the past.  Past Medical History:  Diagnosis Date  . History of traumatic injury to musculoskeletal system    fall 18 feet  . Hyperlipidemia   . 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 Low serum testosterone 07/10/2017   Past Surgical History:  Procedure Laterality Date  . WRIST SURGERY     Social History  Substance Use Topics  . Smoking status: Never Smoker  . Smokeless tobacco: Never Used  . Alcohol use No   family history includes Alcohol abuse in his father; Diabetes in his mother, sister, and sister; Hypertension in his mother, sister, and sister.  ROS: negative except as noted in the HPI  Medications: Current Outpatient Prescriptions  Medication Sig Dispense Refill  . diclofenac sodium (VOLTAREN) 1 % GEL Apply 2 g topically 4 (four) times daily. To affected joint. 100 g 11  . lovastatin (MEVACOR) 10 MG tablet Take 10 mg by mouth daily.  2  . meloxicam (MOBIC) 15 MG tablet One tab PO qAM with breakfast for 2 weeks, then daily prn pain. 30 tablet 3   No current facility-administered medications for this visit.    No Known Allergies     Objective:  BP 111/73   Pulse 60   Wt 152 lb (68.9 kg)    BMI 24.53 kg/m  Gen:  alert, not ill-appearing, no distress, appropriate for age 89: head normocephalic without obvious abnormality, conjunctiva and cornea clear, trachea midline Pulm: Normal work of breathing, normal phonation Neuro: alert and oriented x 3, no tremor MSK: extremities atraumatic, normal gait and station Skin: intact, no rashes on exposed skin, no jaundice, no cyanosis   Depression screen PHQ 2/9 07/03/2017  Decreased Interest 0  Down, Depressed, Hopeless 0  PHQ - 2 Score 0     No results found for this or any previous visit (from the past 72 hour(s)). No results found.    Assessment and Plan: 36 y.o. 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 Low serum testosterone Lab Results  Component Value Date   TESTOSTERONE 118 (L) 07/08/2017  - this is a secondary hypogonadism of uncertain etiology. MR brain pending. Results were reviewed with patient and his wife. Questions were answered. Referring to reproductive endocrinology for further work-up and management  3. Hyperprolactinemia ([place]) - MR brain scheduled for 07/22/17 to rule out pituitary adenoma   Patient education and anticipatory guidance given Patient agrees with treatment plan Follow-up in  6 months for medication management or sooner as needed if symptoms worsen or fail to improve  Darlyne Russian PA-C

## 2017-07-22 ENCOUNTER — Ambulatory Visit (INDEPENDENT_AMBULATORY_CARE_PROVIDER_SITE_OTHER): Payer: Self-pay

## 2017-07-22 DIAGNOSIS — E230 Hypopituitarism: Secondary | ICD-10-CM

## 2017-07-22 DIAGNOSIS — R7989 Other specified abnormal findings of blood chemistry: Secondary | ICD-10-CM

## 2017-07-22 DIAGNOSIS — D352 Benign neoplasm of pituitary gland: Secondary | ICD-10-CM | POA: Insufficient documentation

## 2017-07-22 DIAGNOSIS — E221 Hyperprolactinemia: Secondary | ICD-10-CM

## 2017-07-22 DIAGNOSIS — E229 Hyperfunction of pituitary gland, unspecified: Principal | ICD-10-CM

## 2017-07-22 MED ORDER — GADOBENATE DIMEGLUMINE 529 MG/ML IV SOLN
10.0000 mL | Freq: Once | INTRAVENOUS | Status: AC | PRN
  Administered 2017-07-22: 10 mL via INTRAVENOUS

## 2017-07-22 NOTE — Progress Notes (Signed)
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 This is the cause of the low testosterone and infertility. This is treatable with medicines.  In addition to following up with [facility], who will treat the infertility, I am going to refer you to an Endocrinologist to treat the microadenoma.  Best, Evlyn Clines

## 2017-08-01 ENCOUNTER — Ambulatory Visit (INDEPENDENT_AMBULATORY_CARE_PROVIDER_SITE_OTHER): Payer: Self-pay | Admitting: Sports Medicine

## 2017-08-01 ENCOUNTER — Encounter: Payer: Self-pay | Admitting: Sports Medicine

## 2017-08-01 ENCOUNTER — Encounter: Payer: Self-pay | Admitting: Endocrinology

## 2017-08-01 ENCOUNTER — Ambulatory Visit (INDEPENDENT_AMBULATORY_CARE_PROVIDER_SITE_OTHER): Payer: Self-pay | Admitting: Endocrinology

## 2017-08-01 VITALS — BP 112/70 | HR 75 | Wt 155.4 lb

## 2017-08-01 DIAGNOSIS — M19012 Primary osteoarthritis, left shoulder: Secondary | ICD-10-CM

## 2017-08-01 DIAGNOSIS — M19011 Primary osteoarthritis, right shoulder: Secondary | ICD-10-CM

## 2017-08-01 DIAGNOSIS — D352 Benign neoplasm of pituitary gland: Secondary | ICD-10-CM

## 2017-08-01 DIAGNOSIS — E229 Hyperfunction of pituitary gland, unspecified: Secondary | ICD-10-CM

## 2017-08-01 MED ORDER — BROMOCRIPTINE MESYLATE 2.5 MG PO TABS: 2.5000 mg | ORAL_TABLET | Freq: Every day | ORAL | 5 refills | Status: DC

## 2017-08-01 NOTE — Progress Notes (Signed)
Subjective:    Patient ID: Blake Thornton, male    DOB: 1981/02/27, 36 y.o.   MRN: [id number]  HPI Pt is referred by Patriciaann Clan, PA, for hyperprolactinemia and hypogonadism.  Wife translates.  Pt reports he had puberty at age 50.  He has no biological children.  He and wife have been trying to conceive a child x 6 months, without success.  Pt's wife has 3 children from a previous marriage.  Pt's former girlfriend suffered a miscarriage.  He says he has never taken illicit androgens.  He has never been on any prescribed medication for hypogonadism.  He does not take antiandrogens or opioids.  He denies any h/o XRT, or genital infection.  He had a serious head injury in 2010.  He says he was in [facility] then.  No h/o injury of serious injury to the genital area.  He has no h/o sleep apnea or DVT.   He does not consume alcohol excessively.  He has slight headache, and assoc dizziness.  Past Medical History:  Diagnosis Date  . History of traumatic injury to musculoskeletal system    fall 18 feet  . Hyperlipidemia   . 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 Low serum testosterone 07/10/2017    Past Surgical History:  Procedure Laterality Date  . WRIST SURGERY      Social History   Socioeconomic History  . Marital status: Married    Spouse name: Not on file  . Number of children: Not on file  . Years of education: Not on file  . Highest education level: Not on file  Social Needs  . Financial resource strain: Not on file  . Food insecurity - worry: Not on file  . Food insecurity - inability: Not on file  . Transportation needs - medical: Not on file  . Transportation needs - non-medical: Not on file  Occupational History  . Not on file  Tobacco Use  . Smoking status: Never Smoker  . Smokeless tobacco: Never Used  Substance and Sexual Activity  . Alcohol use: No  . Drug use: No  . Sexual activity: Yes  Other Topics Concern  . Not on file  Social History  Narrative  . Not on file    Current Outpatient Medications on File Prior to Visit  Medication Sig Dispense Refill  . diclofenac sodium (VOLTAREN) 1 % GEL Apply 2 g topically 4 (four) times daily. To affected joint. 100 g 11  . lovastatin (MEVACOR) 10 MG tablet Take 10 mg by mouth daily.  2  . meloxicam (MOBIC) 15 MG tablet One tab PO qAM with breakfast for 2 weeks, then daily prn pain. 30 tablet 3  . [DISCONTINUED] diphenhydrAMINE (BENADRYL) 25 mg capsule Take 25 mg by mouth every 6 (six) hours as needed.     No current facility-administered medications on file prior to visit.     No Known Allergies  Family History  Problem Relation Age of Onset  . Hypertension Mother   . Diabetes Mother   . Alcohol abuse Father   . Hypertension Sister   . Diabetes Sister   . Hypertension Sister   . Diabetes Sister   . Other Neg Hx        hyperprolactinemia    BP 112/70 (BP Location: Left Arm, Patient Position: Sitting, Cuff Size: Normal)   Pulse 75   Wt 155 lb 6.4 oz (70.5 kg)   SpO2 98%   BMI 25.08 kg/m  Review of Systems denies depression, numbness, weight change, difficulty with urination, gynecomastia, fever, easy bruising, sob, rash, rhinorrhea, or leg edema.  He has nausea, ED sxs, intermitt diplopia, and generalized weakness.       Objective:   Physical Exam VS: see vs page GEN: no distress HEAD: head: no deformity eyes: no periorbital swelling, no proptosis external nose and ears are normal mouth: no lesion seen NECK: supple, thyroid is not enlarged CHEST WALL: no deformity LUNGS: clear to auscultation BREASTS:  No gynecomastia CV: reg rate and rhythm, no murmur ABD: abdomen is soft, nontender.  no hepatosplenomegaly.  not distended.  no hernia GENITALIA:  Normal male.   MUSCULOSKELETAL: muscle bulk and strength are grossly normal.  no obvious joint swelling.  gait is normal and steady EXTEMITIES: no leg edema PULSES: no carotid bruit NEURO:  cn 2-12 grossly  intact.   readily moves all 4's.  sensation is intact to touch on all 4's SKIN:  Normal texture and temperature.  No rash or suspicious lesion is visible.  Normal hair distribution NODES:  None palpable at the neck.   PSYCH: alert, well-oriented.  Does not appear anxious nor depressed.   Prolactin=46  Lab Results  Component Value Date   TSH 1.27 07/04/2017    Lab Results  Component Value Date   TESTOSTERONE 118 (L) 07/08/2017   MRI: normal appearance the brain itself. 4 mm hypoenhancing focus in the right side of the gland consistent with pituitary microadenoma.    Assessment & Plan:  Hyperprolactinemia, new, uncertain etiology Hypogonadism, uncertain relationship to the above Pituitary microadenoma: uncertain relationship to the above.   Patient Instructions  Please start taking "bromocriptine," to bring down the prolactin. It has possible side effects of nausea and dizziness.  These go away with time.  You can avoid these by taking it at bedtime, and by taking just take 1/2 pill for the first week. This will increase your chances of fathering a child.     Please come back for a follow-up appointment in 1 month. Our plan will be to see if this gets the prolactin down to normal.   The next step will be to see if this gets the testosterone back to normal.

## 2017-08-01 NOTE — Patient Instructions (Addendum)
Please start taking "bromocriptine," to bring down the prolactin. It has possible side effects of nausea and dizziness.  These go away with time.  You can avoid these by taking it at bedtime, and by taking just take 1/2 pill for the first week. This will increase your chances of fathering a child.     Please come back for a follow-up appointment in 1 month. Our plan will be to see if this gets the prolactin down to normal.   The next step will be to see if this gets the testosterone back to normal.

## 2017-08-01 NOTE — Assessment & Plan Note (Signed)
X-rays confirm bilateral glenohumeral osteoarthritis. Bilateral injections placed into the shoulder joints, he will go to physical therapy and make his first appointment. Return to see me in 1 month.

## 2017-08-01 NOTE — Progress Notes (Signed)
  Subjective:    CC: Follow-up  HPI: Bilateral shoulder pain: With somewhat of a unclear etiology at the initial visit, neck versus shoulders, shoulder x-rays did show bilateral glenohumeral osteoarthritis.  He did not do any physical therapy, use a little bit of a topical NSAID that has been minimally effective.  Pain is moderate, persistent, localized without radiation.  Past medical history:  Negative.  See flowsheet/record as well for more information.  Surgical history: Negative.  See flowsheet/record as well for more information.  Family history: Negative.  See flowsheet/record as well for more information.  Social history: Negative.  See flowsheet/record as well for more information.  Allergies, and medications have been entered into the medical record, reviewed, and no changes needed.   Review of Systems: No fevers, chills, night sweats, weight loss, chest pain, or shortness of breath.   Objective:    General: Well Developed, well nourished, and in no acute distress.  Neuro: Alert and oriented x3, extra-ocular muscles intact, sensation grossly intact.  HEENT: Normocephalic, atraumatic, pupils equal round reactive to light, neck supple, no masses, no lymphadenopathy, thyroid nonpalpable.  Skin: Warm and dry, no rashes. Cardiac: Regular rate and rhythm, no murmurs rubs or gallops, no lower extremity edema.  Respiratory: Clear to auscultation bilaterally. Not using accessory muscles, speaking in full sentences.  Procedure: Real-time Ultrasound Guided Injection of left glenohumeral joint Device: GE Logiq E  Verbal informed consent obtained.  Time-out conducted.  Noted no overlying erythema, induration, or other signs of local infection.  Skin prepped in a sterile fashion.  Local anesthesia: Topical Ethyl chloride.  With sterile technique and under real time ultrasound guidance: Using a 22-gauge spinal needle advanced into the clinic humeral joint from a posterior approach, I then  injected 1 cc kenalog 40, 2 cc lidocaine, 2 cc bupivacaine. Completed without difficulty  Pain immediately resolved suggesting accurate placement of the medication.  Advised to call if fevers/chills, erythema, induration, drainage, or persistent bleeding.  Images permanently stored and available for review in the ultrasound unit.  Impression: Technically successful ultrasound guided injection.  Procedure: Real-time Ultrasound Guided Injection of right glenohumeral joint Device: GE Logiq E  Verbal informed consent obtained.  Time-out conducted.  Noted no overlying erythema, induration, or other signs of local infection.  Skin prepped in a sterile fashion.  Local anesthesia: Topical Ethyl chloride.  With sterile technique and under real time ultrasound guidance: Using a 22-gauge spinal needle advanced into the clinic humeral joint from a posterior approach, I then injected 1 cc kenalog 40, 2 cc lidocaine, 2 cc bupivacaine. Completed without difficulty  Pain immediately resolved suggesting accurate placement of the medication.  Advised to call if fevers/chills, erythema, induration, drainage, or persistent bleeding.  Images permanently stored and available for review in the ultrasound unit.  Impression: Technically successful ultrasound guided injection.  Impression and Recommendations:    Primary osteoarthritis of both shoulders X-rays confirm bilateral glenohumeral osteoarthritis. Bilateral injections placed into the shoulder joints, he will go to physical therapy and make his first appointment. Return to see me in 1 month.  ___________________________________________ Gwen Her. Dianah Field, M.D., ABFM., CAQSM. Primary Care and [place] Instructor of [employer] [facility] [employer]

## 2017-08-06 ENCOUNTER — Ambulatory Visit: Payer: Self-pay | Admitting: Rehabilitative and Restorative Service Providers"

## 2017-08-12 ENCOUNTER — Ambulatory Visit (INDEPENDENT_AMBULATORY_CARE_PROVIDER_SITE_OTHER): Payer: Self-pay | Admitting: Rehabilitative and Restorative Service Providers"

## 2017-08-12 DIAGNOSIS — M25511 Pain in right shoulder: Secondary | ICD-10-CM

## 2017-08-12 DIAGNOSIS — R293 Abnormal posture: Secondary | ICD-10-CM

## 2017-08-12 DIAGNOSIS — M25512 Pain in left shoulder: Secondary | ICD-10-CM

## 2017-08-12 DIAGNOSIS — M542 Cervicalgia: Secondary | ICD-10-CM

## 2017-08-12 DIAGNOSIS — R29898 Other symptoms and signs involving the musculoskeletal system: Secondary | ICD-10-CM

## 2017-08-12 DIAGNOSIS — G8929 Other chronic pain: Secondary | ICD-10-CM

## 2017-08-12 NOTE — Therapy (Signed)
[facility]  [place], [place], [address] Phone: [phone]   Fax:  [phone]  Physical Therapy Evaluation  Patient Details  Name: Blake Thornton MRN: [id number] Date of Birth: 1980/12/19 Referring Provider: Dr Dianah Field   Encounter Date: 08/12/2017  PT End of Session - 08/12/17 1140    Visit Number  1    Number of Visits  12    Date for PT Re-Evaluation  09/23/17    PT Start Time  3500    PT Stop Time  1245    PT Time Calculation (min)  61 min    Activity Tolerance  Patient tolerated treatment well       Past Medical History:  Diagnosis Date  . History of traumatic injury to musculoskeletal system    fall 18 feet  . Hyperlipidemia   . 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 Low serum testosterone 07/10/2017    Past Surgical History:  Procedure Laterality Date  . WRIST SURGERY      There were no vitals filed for this visit.   Subjective Assessment - 08/12/17 1155    Subjective  Having a lot of shoulder and neck pain which was diagnosed with arthritis with symptoms present over the past 6 months. There was no known injury.     Patient is accompained by:  Family member;Interpreter    Pertinent History  Rt shoulder pain following accident ~ 8 years ago and has had intermittent pain since that time - patient fell 16 ft on concrete Lt arm and wrist then onto his forehead and Rt arm - fx Lt wrist and dislocation Rt shoulder hospitalized for ~ 1 week; denies any other medical problems; treatment for tumor on pitutary gland     How long can you sit comfortably?  5-6 min     How long can you stand comfortably?  30 min     How long can you walk comfortably?  5-6 min     Diagnostic tests  xrays show arthritis     Patient Stated Goals  feel functional again     Currently in Pain?  Yes    Pain Score  6     Pain Location  Shoulder    Pain Orientation  Right    Pain Descriptors / Indicators  Throbbing     Pain Type  Chronic pain    Pain Radiating Towards  Rt arm to wrist     Pain Onset  More than a month ago    Pain Frequency  Constant    Aggravating Factors   working - after work it is worse     Pain Relieving Factors  putting arm in sling; leaving it alone     Multiple Pain Sites  Yes    Pain Score  3    Pain Location  Shoulder    Pain Orientation  Left    Pain Descriptors / Indicators  Tiring;Throbbing    Pain Type  Chronic pain    Pain Radiating Towards  down arm to wrist     Pain Onset  More than a month ago    Pain Frequency  Constant    Aggravating Factors   working     Pain Relieving Factors  not using arm          [facility] PT Assessment - 08/12/17 0001      Assessment   Medical Diagnosis  Neck and bilateral shoudler pain     Referring Provider  Dr Dianah Field    Onset Date/Surgical Date  01/22/17 some symptoms present for the past 8 years     Hand Dominance  Right    Next MD Visit  08/29/17    Prior Therapy  none       Precautions   Precautions  None      Balance Screen   Has the patient fallen in the past 6 months  Yes    How many times?  1 no injury     Has the patient had a decrease in activity level because of a fear of falling?   No    Is the patient reluctant to leave their home because of a fear of falling?   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; sometimes with assist up to 800 pounds; bent forward much of the day; climbing ladder - 12-13 hours/day 5-7 days a week - 15 years     Leisure  sedentary       Observation/Other Assessments   Focus on Therapeutic Outcomes (FOTO)   55% limitation      Sensation   Additional Comments  tingling into the Rt/Lt long/ring/little fingers on an intermittent basis       Posture/Postural Control   Posture Comments  significant head forward posture; incresaed  thoracic kyphosis; scapyulae abducted and rotated along the thoracic wall; head of the humerus anterior in orientation       AROM   Right/Left Shoulder  -- pain with all Rt shoulder motions     Right Shoulder Extension  51 Degrees    Right Shoulder Flexion  72 Degrees    Right Shoulder ABduction  128 Degrees    Right Shoulder Internal Rotation  40 Degrees    Right Shoulder External Rotation  52 Degrees    Left Shoulder Extension  57 Degrees    Left Shoulder Flexion  148 Degrees    Left Shoulder ABduction  149 Degrees    Left Shoulder Internal Rotation  38 Degrees    Left Shoulder External Rotation  53 Degrees    Cervical Flexion  38    Cervical Extension  30    Cervical - Right Side Bend  35    Cervical - Left Side Bend  31    Cervical - Right Rotation  65    Cervical - Left Rotation  68      Strength   Overall Strength Comments  moves in all planes against gravity       Palpation   Spinal mobility  hypomobile cervical and thoracic spine with CPA and lateral mobs     Palpation comment  significant muscular tightness pecs; ant/lat/post cervical musculature; upper traps; teres             Objective measurements completed on examination: See above findings.      [facility] Adult PT Treatment/Exercise - 08/12/17 0001      Therapeutic Activites    Therapeutic Activities  -- myofacial ball release work       Neuro Re-ed    Neuro Re-ed Details   postural correction encouraging posterior shoulder girdle engagement      Shoulder Exercises: Standing   Other Standing Exercises  scap squeeze with noodle 10 sec x 5; axial extension 10 sec x 5; L's; W's  Shoulder Exercises: Stretch   Other Shoulder Stretches  3 way doorway 20-30 sec x 1 each position to pt tolerance vc for position and to avoid bouncing     Other Shoulder Stretches  prolonged snow angel supine ~ 2-3 min arms at ~ 65 deg       Electrical Stimulation   Electrical Stimulation Action  trial of TENS unit ~ 5 min        Manual Therapy   Manual therapy comments  pt supine     Joint Mobilization  gentle CPA mobs cervical and thoracic spine     Soft tissue mobilization  cervical     Myofascial Release  sternum              PT Education - 08/12/17 1242    Education provided  Yes    Education Details  postural correction; HEP; myofacial ball release work ; Engineer, building services) Educated  Patient;Spouse    Methods  Explanation;Demonstration;Tactile cues;Verbal cues;Handout    Comprehension  Verbalized understanding;Returned demonstration;Verbal cues required;Tactile cues required          PT Long Term Goals - 08/12/17 1400      PT LONG TERM GOAL #1   Title  Improve posture and alignment with patient to demonstrated upright posture with posterior shoulder girdle musculature engaged 09/23/17    Time  6    Period  Weeks    Status  New      PT LONG TERM GOAL #2   Title  Increase cervical and bilat shoulder ROM by 5-15 degrees throughout 09/23/17    Time  6    Period  Weeks    Status  New      PT LONG TERM GOAL #3   Title  Decrease pain by 50-75% allowing patient to sleep for 4-5 hours without awakening due to pain 09/23/17    Time  6    Period  Weeks    Status  New      PT LONG TERM GOAL #4   Title  Independent in HEP 09/23/17    Time  6    Period  Weeks    Status  New      PT LONG TERM GOAL #5   Title  Improve FOTO to </= 55% limitation 09/23/17    Time  6    Period  Weeks    Status  New             Plan - 08/12/17 1300    Clinical Impression Statement  Blake Thornton presents with chronic recurrent Rt > Lt shoulder pain with initial injury ~ 8 years ago due to 16 ft fall from roof. He has had incresaed pian in the neck and shoulders for the past 6 months. Patient presents with very poor posture and alignment; limited cervical and thoracic mobiilty; decreased bilat shoulder ROM; pain with AROM bilat shoulders; decreased functional activity level; pain limiting ability to sleep.  Patient will benefit from PT to address problems identified.     History and Personal Factors relevant to plan of care:  Lx Lt wrist; bilat shoulder injuries from fall ~8 years ago    Clinical Presentation  Evolving    Clinical Decision Making  Low    Rehab Potential  Good    PT Frequency  2x / week    PT Duration  6 weeks    PT Treatment/Interventions  Patient/family education;ADLs/Self Care Home Management;Cryotherapy;Electrical Stimulation;Iontophoresis 4mg /ml Dexamethasone;Moist Heat;Ultrasound;Dry needling;Manual  techniques;Neuromuscular re-education;Therapeutic activities;Therapeutic exercise    PT Next Visit Plan  review HEP; manual work to release tightness anterior shoulder/pecs/upper trap/cervical musculature; neuromuscular re-education working on posture and alignment; modalities as indicated     Consulted and Agree with Plan of Care  Patient       Patient will benefit from skilled therapeutic intervention in order to improve the following deficits and impairments:  Postural dysfunction, Improper body mechanics, Impaired UE functional use, Increased muscle spasms, Increased fascial restricitons, Decreased range of motion, Decreased strength, Pain, Decreased activity tolerance, Decreased mobility  Visit Diagnosis: Chronic right shoulder pain - Plan: PT plan of care cert/re-cert  Chronic left shoulder pain - Plan: PT plan of care cert/re-cert  Cervicalgia - Plan: PT plan of care cert/re-cert  Other symptoms and signs involving the musculoskeletal system - Plan: PT plan of care cert/re-cert  Abnormal posture - Plan: PT plan of care cert/re-cert     Problem List Patient Active Problem List   Diagnosis Date Noted  . 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 Low serum testosterone 07/10/2017  . Family history of diabetes mellitus 07/05/2017  . Fatigue 07/05/2017  . Lateral epicondylitis of right elbow 07/05/2017  . Low  libido 07/05/2017  . Weakness generalized 07/05/2017  . Primary osteoarthritis of both shoulders 07/04/2017    Maghan Jessee Nilda Simmer PT, MPH  08/12/2017, 2:08 PM  [facility] [place] [place], [place], [address] Phone: [phone]   Fax:  [phone]  Name: Blake Thornton MRN: [id number] Date of Birth: [birth year]

## 2017-08-12 NOTE — Patient Instructions (Addendum)
SUPINE Tips A    Being in the supine position means to be lying on the back. Lying on the back is the position of least compression on the bones and discs of the spine, and helps to re-align the natural curves of the back. 2-5 min can bend elbows to release stretch     Axial Extension (Chin Tuck)    Pull chin in and lengthen back of neck. Hold __5__ seconds while counting out loud. Repeat __10__ times. Do __several__ sessions per day.  Shoulder Blade Squeeze    Rotate shoulders back, then squeeze shoulder blades down and back  Hold 10 sec Repeat _10___ times. Do __several __ sessions per day.  Upper Back Strength: Lower Trapezius / Rotator Cuff " L's "     Arms in waitress pose, palms up. Press hands back and slide shoulder blades down. Hold for __5__ seconds. Repeat _10___ times. 1-2 times per day.    Scapular Retraction: Elbow Flexion (Standing)  "W's"     With elbows bent to 90, pinch shoulder blades together and rotate arms out, keeping elbows bent. Repeat __10__ times per set. Do __1-2__ sets per session. Do _several ___ sessions per day.  Scapula Adduction With Pectoralis Stretch: Low - Standing   Shoulders at 45 hands even with shoulders, keeping weight through legs, shift weight forward until you feel pull or stretch through the front of your chest. Hold _30__ seconds. Do _3__ times, _2-4__ times per day.   Scapula Adduction With Pectoralis Stretch: Mid-Range - Standing   Shoulders at 90 elbows even with shoulders, keeping weight through legs, shift weight forward until you feel pull or strength through the front of your chest. Hold __30_ seconds. Do _3__ times, __2-4_ times per day.   Scapula Adduction With Pectoralis Stretch: High - Standing   Shoulders at 120 hands up high on the doorway, keeping weight on feet, shift weight forward until you feel pull or stretch through the front of your chest. Hold _30__ seconds. Do _3__ times, _2-3__ times per  day.   TENS UNIT: This is helpful for muscle pain and spasm.   Search and Purchase a TENS 7000 2nd edition at www.tenspros.com. It should be less than $30.     TENS unit instructions: Do not shower or bathe with the unit on Turn the unit off before removing electrodes or batteries If the electrodes lose stickiness add a drop of water to the electrodes after they are disconnected from the unit and place on plastic sheet. If you continued to have difficulty, call the TENS unit company to purchase more electrodes. Do not apply lotion on the skin area prior to use. Make sure the skin is clean and dry as this will help prolong the life of the electrodes. After use, always check skin for unusual red areas, rash or other skin difficulties. If there are any skin problems, does not apply electrodes to the same area. Never remove the electrodes from the unit by pulling the wires. Do not use the TENS unit or electrodes other than as directed. Do not change electrode placement without consultating your therapist or physician. Keep 2 fingers with between each electrode.     [facility] [place] [address] [place], [place] [address]  [phone] (office) [phone] (fax)

## 2017-08-22 ENCOUNTER — Encounter: Payer: Self-pay | Admitting: Rehabilitative and Restorative Service Providers"

## 2017-08-22 ENCOUNTER — Ambulatory Visit: Payer: Self-pay | Admitting: Rehabilitative and Restorative Service Providers"

## 2017-08-22 DIAGNOSIS — R293 Abnormal posture: Secondary | ICD-10-CM

## 2017-08-22 DIAGNOSIS — M542 Cervicalgia: Secondary | ICD-10-CM

## 2017-08-22 DIAGNOSIS — M25511 Pain in right shoulder: Secondary | ICD-10-CM

## 2017-08-22 DIAGNOSIS — R29898 Other symptoms and signs involving the musculoskeletal system: Secondary | ICD-10-CM

## 2017-08-22 DIAGNOSIS — G8929 Other chronic pain: Secondary | ICD-10-CM

## 2017-08-22 DIAGNOSIS — M25512 Pain in left shoulder: Secondary | ICD-10-CM

## 2017-08-22 NOTE — Patient Instructions (Addendum)
Thoracic Lift    Press shoulders down. Then lift mid-thoracic spine (area between the shoulder blades). Lift the breastbone slightly. Hold ___ seconds. Relax. Repeat ___ times.  Copyright  VHI. All rights reserved.  Resisted External Rotation: in Neutral - Bilateral    Sit or stand, tubing in both hands, elbows at sides, bent to 90, forearms forward. Pinch shoulder blades together and tense band without moving the band out. Keep elbows at sides. Repeat __10__ times per set. Do _2-3___ sets per session. Do _2-3___ sessions per day.  ELBOW: Biceps - Standing    Standing in doorway, place one hand on wall, elbow straight. Lean forward. Hold _20-30__ seconds. _3__ reps per set, _2-3__ sets per day   Trigger Point Dry Needling  . What is Trigger Point Dry Needling (DN)? o DN is a physical therapy technique used to treat muscle pain and dysfunction. Specifically, DN helps deactivate muscle trigger points (muscle knots).  o A thin filiform needle is used to penetrate the skin and stimulate the underlying trigger point. The goal is for a local twitch response (LTR) to occur and for the trigger point to relax. No medication of any kind is injected during the procedure.   . What Does Trigger Point Dry Needling Feel Like?  o The procedure feels different for each individual patient. Some patients report that they do not actually feel the needle enter the skin and overall the process is not painful. Very mild bleeding may occur. However, many patients feel a deep cramping in the muscle in which the needle was inserted. This is the local twitch response.   Marland Kitchen How Will I feel after the treatment? o Soreness is normal, and the onset of soreness may not occur for a few hours. Typically this soreness does not last longer than two days.  o Bruising is uncommon, however; ice can be used to decrease any possible bruising.  o In rare cases feeling tired or nauseous after the treatment is normal. In  addition, your symptoms may get worse before they get better, this period will typically not last longer than 24 hours.   . What Can I do After My Treatment? o Increase your hydration by drinking more water for the next 24 hours. o You may place ice or heat on the areas treated that have become sore, however, do not use heat on inflamed or bruised areas. Heat often brings more relief post needling. o You can continue your regular activities, but vigorous activity is not recommended initially after the treatment for 24 hours. o DN is best combined with other physical therapy such as strengthening, stretching, and other therapies.

## 2017-08-22 NOTE — Therapy (Addendum)
[place], [place], [address] Phone: [phone]   Fax:  [phone]  Physical Therapy Treatment  Patient Details  Name: Blake Thornton MRN: [id number] Date of Birth: [birth year] Referring Provider: Dr Dianah Field   Encounter Date: 08/22/2017  PT End of Session - 08/22/17 0849    Visit Number  2    Number of Visits  12    Date for PT Re-Evaluation  09/23/17    PT Start Time  0847    PT Stop Time  0945    PT Time Calculation (min)  58 min       Past Medical History:  Diagnosis Date  . History of traumatic injury to musculoskeletal system    fall 18 feet  . Hyperlipidemia   . Hyperprolactinemia ([place]) 07/10/2017  . Low serum testosterone 07/10/2017    Past Surgical History:  Procedure Laterality Date  . WRIST SURGERY      There were no vitals filed for this visit.  Subjective Assessment - 08/22/17 0851    Subjective  Still hurting. Working on the exrercises at home TENS unit helps a lot with sleep. Feels sore in the mid back after exercise today. Continues to feel his Lt shoulder is going to come out of place with certain movements/activities     Currently in Pain?  Yes    Pain Score  4     Pain Location  Shoulder    Pain Orientation  Right    Pain Descriptors / Indicators  Throbbing    Pain Type  Chronic pain    Pain Score  4    Pain Location  Shoulder    Pain Orientation  Left                      [facility] Adult PT Treatment/Exercise - 08/22/17 0001      Neuro Re-ed    Neuro Re-ed Details   postural correction encouraging posterior shoulder girdle engagement      Shoulder Exercises: Supine   Other Supine Exercises  chest lift 3-5 sec x 20       Shoulder Exercises: Standing   External Rotation  Strengthening;Right;Left;10 reps;Theraband isometric     Theraband Level (Shoulder External Rotation)  Level 2 (Red)    Other Standing Exercises  scap squeeze with noodle  10 sec x 5; axial extension 10 sec x 5; L's; W's       Shoulder Exercises: Stretch   Other Shoulder Stretches  3 way doorway 20-30 sec x 1 each position to pt tolerance vc for position and to avoid bouncing     Other Shoulder Stretches  prolonged snow angel supine ~ 2-3 min arms at ~ 45 deg - lower to pt tolerance       Moist Heat Therapy   Number Minutes Moist Heat  20 Minutes    Moist Heat Location  Shoulder bilat       Electrical Stimulation   Electrical Stimulation Location  bilat shoulders posterior to triceps area     Electrical Stimulation Action  HVGS    Electrical Stimulation Parameters  to tolerance    Electrical Stimulation Goals  Pain;Tone      Manual Therapy   Manual therapy comments  pt supine     Joint Mobilization  gentle CPA mobs cervical and thoracic spine; circumduction bilat shoulders     Soft tissue mobilization  bilat pecs; teres; cervical; upper trap  Myofascial Release  pecs/sternum     Passive ROM  bilat shoulders into flexion; ER in scapular plane     Manual Traction  through the UE - long arm              PT Education - 08/22/17 0923    Education provided  Yes    Education Details  HEP     Person(s) Educated  Patient;Spouse    Methods  Explanation;Demonstration;Tactile cues;Verbal cues;Handout    Comprehension  Verbalized understanding;Returned demonstration;Verbal cues required          PT Long Term Goals - 08/22/17 0942      PT LONG TERM GOAL #1   Title  Improve posture and alignment with patient to demonstrated upright posture with posterior shoulder girdle musculature engaged 09/23/17    Time  6    Period  Weeks    Status  On-going      PT LONG TERM GOAL #2   Title  Increase cervical and bilat shoulder ROM by 5-15 degrees throughout 09/23/17    Time  6    Period  Weeks    Status  On-going      PT LONG TERM GOAL #3   Title  Decrease pain by 50-75% allowing patient to sleep for 4-5 hours without awakening due to pain 09/23/17     Time  6    Period  Weeks    Status  On-going      PT LONG TERM GOAL #4   Title  Independent in HEP 09/23/17    Time  6    Period  Weeks    Status  On-going      PT LONG TERM GOAL #5   Title  Improve FOTO to </= 55% limitation 09/23/17    Time  6    Period  Weeks    Status  On-going            Plan - 08/22/17 0940    Clinical Impression Statement  Working on exercise and attempting to work on posture. Continues to have pain as would be expected due to chronic nature of problems. Patient responded well to manual work and modalities with decresae in pain reported. He had difficulty with neuromuscular correction and will focus on posture and postural correction for until next appt. Anticipate progress will be slow due to long standing condition and continued very physical forward work.     Rehab Potential  Good    PT Frequency  2x / week    PT Duration  6 weeks    PT Treatment/Interventions  Patient/family education;ADLs/Self Care Home Management;Cryotherapy;Electrical Stimulation;Iontophoresis 32m/ml Dexamethasone;Moist Heat;Ultrasound;Dry needling;Manual techniques;Neuromuscular re-education;Therapeutic activities;Therapeutic exercise    PT Next Visit Plan  review HEP; manual work to release tightness anterior shoulder/pecs/upper trap/cervical musculature; neuromuscular re-education working on posture and alignment; modalities as indicated work through posterior shoulder and triceps - thoracic mobs progress bands if posterior shoulder girdle is engaged     COncologistwith Plan of Care  Patient       Patient will benefit from skilled therapeutic intervention in order to improve the following deficits and impairments:  Postural dysfunction, Improper body mechanics, Impaired UE functional use, Increased muscle spasms, Increased fascial restricitons, Decreased range of motion, Decreased strength, Pain, Decreased activity tolerance, Decreased mobility  Visit  Diagnosis: Chronic right shoulder pain  Chronic left shoulder pain  Cervicalgia  Other symptoms and signs involving the musculoskeletal system  Abnormal posture  Problem List Patient Active Problem List   Diagnosis Date Noted  . 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 Low serum testosterone 07/10/2017  . Family history of diabetes mellitus 07/05/2017  . Fatigue 07/05/2017  . Lateral epicondylitis of right elbow 07/05/2017  . Low libido 07/05/2017  . Weakness generalized 07/05/2017  . Primary osteoarthritis of both shoulders 07/04/2017    Zyona Pettaway Nilda Simmer PT, MPH 08/22/2017, 9:44 AM  [facility] [place], [place], [address] Phone: [phone]   Fax:  [phone]  Name: Blake Thornton MRN: [id number] Date of Birth: 03/03/1981  PHYSICAL THERAPY DISCHARGE SUMMARY  Visits from Start of Care: 2  Current functional level related to goals / functional outcomes: See last progress note for discharge status. Patient reported and demonstrated some improvement but continued to have symptoms.   Remaining deficits: Continued bilat shoulder pain    Education / Equipment: HEP; TENS unit   Plan: Patient agrees to discharge.  Patient goals were not met. Patient is being discharged due to not returning since the last visit.  ?????     Patient would benefit form continued PT to address chronic shoulder pain and dysfunction. He did not have health care insurance which may have contributed to decision to discontinue treatment.   Bladen Umar P. Helene Kelp PT, MPH 10/07/17 11:22 AM

## 2017-08-27 ENCOUNTER — Encounter: Payer: Self-pay | Admitting: Rehabilitative and Restorative Service Providers"

## 2017-08-29 ENCOUNTER — Ambulatory Visit: Payer: Self-pay | Admitting: Sports Medicine

## 2017-08-29 DIAGNOSIS — Z0189 Encounter for other specified special examinations: Secondary | ICD-10-CM

## 2017-08-30 ENCOUNTER — Ambulatory Visit (INDEPENDENT_AMBULATORY_CARE_PROVIDER_SITE_OTHER): Payer: Self-pay | Admitting: Endocrinology

## 2017-08-30 ENCOUNTER — Encounter: Payer: Self-pay | Admitting: Endocrinology

## 2017-08-30 VITALS — BP 102/60 | HR 64 | Wt 156.6 lb

## 2017-08-30 DIAGNOSIS — E229 Hyperfunction of pituitary gland, unspecified: Secondary | ICD-10-CM

## 2017-08-30 DIAGNOSIS — D352 Benign neoplasm of pituitary gland: Secondary | ICD-10-CM

## 2017-08-30 NOTE — Patient Instructions (Addendum)
blood tests are requested for you today.  We'll let you know about the results.    Please come back for a follow-up appointment in 2 months. Our plan will be to see if this gets the prolactin down to normal.   The next step will be to see if this gets the testosterone back to normal.

## 2017-08-30 NOTE — Progress Notes (Signed)
Subjective:    Patient ID: Blake Thornton, male    DOB: [birth year], 36 y.o.   MRN: [id number]  HPI pt returns for f/u of hyperprolactinemia and hypogonadism; wife translates; he had puberty at age 88; he has no biological children; he and wife have been trying to conceive a child x 6 months, without success; wife has 3 children from a previous marriage; pt's former girlfriend suffered a miscarriage; he had a serious head injury in 2010; he says he was in [facility] week then; MRI showed 4 mm adenoma; plan is to normalize prolactin, then see if testosterone normalizes with it).  Since on parlodel, he feels no different.   Past Medical History:  Diagnosis Date  . History of traumatic injury to musculoskeletal system    fall 18 feet  . Hyperlipidemia   . Hyperprolactinemia ([place]) 07/10/2017  . Low serum testosterone 07/10/2017    Past Surgical History:  Procedure Laterality Date  . WRIST SURGERY      Social History   Socioeconomic History  . Marital status: Married    Spouse name: Not on file  . Number of children: Not on file  . Years of education: Not on file  . Highest education level: Not on file  Social Needs  . Financial resource strain: Not on file  . Food insecurity - worry: Not on file  . Food insecurity - inability: Not on file  . Transportation needs - medical: Not on file  . Transportation needs - non-medical: Not on file  Occupational History  . Not on file  Tobacco Use  . Smoking status: Never Smoker  . Smokeless tobacco: Never Used  Substance and Sexual Activity  . Alcohol use: No  . Drug use: No  . Sexual activity: Yes  Other Topics Concern  . Not on file  Social History Narrative  . Not on file    Current Outpatient Medications on File Prior to Visit  Medication Sig Dispense Refill  . bromocriptine (PARLODEL) 2.5 MG tablet Take 1 tablet (2.5 mg total) at bedtime by mouth. 30 tablet 5  . diclofenac sodium (VOLTAREN) 1 % GEL Apply 2 g  topically 4 (four) times daily. To affected joint. 100 g 11  . lovastatin (MEVACOR) 10 MG tablet Take 10 mg by mouth daily.  2  . meloxicam (MOBIC) 15 MG tablet One tab PO qAM with breakfast for 2 weeks, then daily prn pain. 30 tablet 3  . [DISCONTINUED] diphenhydrAMINE (BENADRYL) 25 mg capsule Take 25 mg by mouth every 6 (six) hours as needed.     No current facility-administered medications on file prior to visit.     No Known Allergies  Family History  Problem Relation Age of Onset  . Hypertension Mother   . Diabetes Mother   . Alcohol abuse Father   . Hypertension Sister   . Diabetes Sister   . Hypertension Sister   . Diabetes Sister   . Other Neg Hx        hyperprolactinemia    BP 102/60 (BP Location: Left Arm, Patient Position: Sitting, Cuff Size: Normal)   Pulse 64   Wt 156 lb 9.6 oz (71 kg)   SpO2 97%   BMI 25.28 kg/m   Review of Systems Denies nausea    Objective:   Physical Exam VITAL SIGNS:  See vs page GENERAL: no distress Breasts: no gynecomastia.       Assessment & Plan:  Hyperprolactinemia, due for recheck Hypogonadism, due for  recheck  Patient Instructions  blood tests are requested for you today.  We'll let you know about the results.    Please come back for a follow-up appointment in 2 months. Our plan will be to see if this gets the prolactin down to normal.   The next step will be to see if this gets the testosterone back to normal.

## 2017-08-31 LAB — TESTOSTERONE,FREE AND TOTAL
TESTOSTERONE FREE: 10 pg/mL (ref 8.7–25.1)
Testosterone: 296 ng/dL (ref 264–916)

## 2017-08-31 LAB — PROLACTIN: PROLACTIN: 8.2 ng/mL (ref 2.0–18.0)

## 2017-09-13 ENCOUNTER — Other Ambulatory Visit: Payer: Self-pay

## 2017-09-13 DIAGNOSIS — M542 Cervicalgia: Principal | ICD-10-CM

## 2017-09-13 DIAGNOSIS — M25519 Pain in unspecified shoulder: Secondary | ICD-10-CM

## 2017-09-13 MED ORDER — MELOXICAM 15 MG PO TABS: ORAL_TABLET | ORAL | 3 refills | Status: DC

## 2017-10-17 ENCOUNTER — Encounter: Payer: Self-pay | Admitting: Physician Assistant

## 2017-10-17 ENCOUNTER — Ambulatory Visit (INDEPENDENT_AMBULATORY_CARE_PROVIDER_SITE_OTHER): Payer: Self-pay | Admitting: Physician Assistant

## 2017-10-17 VITALS — BP 105/71 | HR 62 | Temp 97.9°F | Wt 161.0 lb

## 2017-10-17 DIAGNOSIS — K5909 Other constipation: Secondary | ICD-10-CM

## 2017-10-17 DIAGNOSIS — K4090 Unilateral inguinal hernia, without obstruction or gangrene, not specified as recurrent: Secondary | ICD-10-CM | POA: Insufficient documentation

## 2017-10-17 DIAGNOSIS — G8929 Other chronic pain: Secondary | ICD-10-CM

## 2017-10-17 DIAGNOSIS — R1031 Right lower quadrant pain: Secondary | ICD-10-CM

## 2017-10-17 DIAGNOSIS — N50811 Right testicular pain: Secondary | ICD-10-CM

## 2017-10-17 LAB — POCT URINALYSIS DIPSTICK
BILIRUBIN UA: NEGATIVE
Glucose, UA: NEGATIVE
Ketones, UA: NEGATIVE
Leukocytes, UA: NEGATIVE
Nitrite, UA: NEGATIVE
Protein, UA: NEGATIVE
RBC UA: NEGATIVE
Urobilinogen, UA: 0.2 E.U./dL
pH, UA: 6 (ref 5.0–8.0)

## 2017-10-17 MED ORDER — NITROGLYCERIN 0.4 % RE OINT: 1.0000 [in_us] | TOPICAL_OINTMENT | Freq: Two times a day (BID) | RECTAL | 3 refills | Status: DC | PRN

## 2017-10-17 NOTE — Progress Notes (Signed)
HPI:                                                                Blake Thornton is a 37 y.o. male who presents to [facility] today for RLQ abdominal pain  Patient is accompanied by wife, who is acting as Optometrist. Patient signed waiver.  Pleasant 37 yo M with PMH of pituitary microadenoma with hyperprolactinemia, OA of shoulders, and constipation presents with RLQ abdominal pain. Pain is recurrent, gradually worsening over the last 2 years. Prior work-up has included: He had an abdominal x-ray in 2015, which showed nonobstructive bowel gas with mild fecal retention. He had a CT abdomen/pelvis in 2015, which showed moderate constipation, but was otherwise unremarkable. He had a normal CMP, CBC, ESR approximately 3 months ago  He describes pain as a "stretching/pulling and stinging" pain that radiates to his back and testicle. Endorses numbness and tingling in the right testicle. Reports pain is worse with intercourse and with heavy lifting. He has tried NSAIDs without relief. Reports he has been taking Miralax daily for a year and is stooling 3-4 times per day. Treating constipation has not helped his pain at all either. No history of hernia or abdominal surgery.   Depression screen PHQ 2/9 07/03/2017  Decreased Interest 0  Down, Depressed, Hopeless 0  PHQ - 2 Score 0    No flowsheet data found.    Past Medical History:  Diagnosis Date  . History of traumatic injury to musculoskeletal system    fall 18 feet  . Hyperlipidemia   . Hyperprolactinemia ([place]) 07/10/2017  . Low serum testosterone 07/10/2017   Past Surgical History:  Procedure Laterality Date  . WRIST SURGERY     Social History   Tobacco Use  . Smoking status: Never Smoker  . Smokeless tobacco: Never Used  Substance Use Topics  . Alcohol use: No   family history includes Alcohol abuse in his father; Diabetes in his mother, sister, and sister; Hypertension in  his mother, sister, and sister.    ROS: negative except as noted in the HPI  Medications: Current Outpatient Medications  Medication Sig Dispense Refill  . Polyethylene Glycol 3350 (MIRALAX PO) Take by mouth.    . bromocriptine (PARLODEL) 2.5 MG tablet Take 1 tablet (2.5 mg total) at bedtime by mouth. 30 tablet 5  . diclofenac sodium (VOLTAREN) 1 % GEL Apply 2 g topically 4 (four) times daily. To affected joint. 100 g 11  . lovastatin (MEVACOR) 10 MG tablet Take 10 mg by mouth daily.  2  . meloxicam (MOBIC) 15 MG tablet One tab PO qAM with breakfast for 2 weeks, then daily prn pain. 30 tablet 3   No current facility-administered medications for this visit.    No Known Allergies     Objective:  BP 105/71   Pulse 62   Temp 97.9 F (36.6 C) (Oral)   Wt 161 lb (73 kg)   BMI 25.99 kg/m  Gen:  alert, not ill-appearing, no distress, appropriate for age 71: head normocephalic without obvious abnormality, conjunctiva and cornea clear, trachea midline Pulm: Normal work of breathing, normal phonation, clear to auscultation bilaterally CV: Normal rate, regular rhythm, s1 and s2 distinct, no murmurs, clicks or rubs  GI: abdomen normal appearing, patient localizes pain to the right lumbar region just lateral to his rectus abdominus, it is nontender, there is no palpable mass,   Neuro: alert and oriented x 3, no tremor MSK: extremities atraumatic, normal gait and station Skin: intact, no rashes on exposed skin, no jaundice, no cyanosis Psych: well-groomed, cooperative, good eye contact    Results for orders placed or performed in visit on 10/17/17 (from the past 72 hour(s))  POCT Urinalysis Dipstick     Status: Abnormal   Collection Time: 10/17/17 11:43 AM  Result Value Ref Range   Color, UA dark yellow    Clarity, UA clear    Glucose, UA negaitve    Bilirubin, UA negative    Ketones, UA negative    Spec Grav, UA >=1.030 (A) 1.010 - 1.025   Blood, UA negative    pH, UA 6.0  5.0 - 8.0   Protein, UA negative    Urobilinogen, UA 0.2 0.2 or 1.0 E.U./dL   Nitrite, UA negative    Leukocytes, UA Negative Negative   Appearance     Odor     No results found.  Lab Results  Component Value Date   ALT 18 07/04/2017   AST 15 07/04/2017   BILITOT 0.6 07/04/2017   Lab Results  Component Value Date   CREATININE 0.70 07/04/2017   BUN 20 07/04/2017   NA 138 07/04/2017   K 4.2 07/04/2017   CL 105 07/04/2017   CO2 26 07/04/2017   Lab Results  Component Value Date   WBC 5.8 07/04/2017   HGB 13.3 07/04/2017   HCT 39.2 07/04/2017   MCV 82.2 07/04/2017   PLT 229 07/04/2017   Lab Results  Component Value Date   ESRSEDRATE 6 07/04/2017     Assessment and Plan: 37 y.o. male with   1. Chronic RLQ pain - negative work-up to date,most recent imaging in 2015, labs from 3 months ago unremarkable. - differential includes testicular source (varicocele) versus chronic abdominal wall pain versus function abdominal pain (IBS-C). Recommend a trigger point injection as both a diagnostic/therapeutic option. If patient responds, then this is likely abdominal wall pain. If no response, then further work-up may be indicated.  - POCT Urinalysis Dipstick negative - C. trachomatis/N. gonorrhoeae RNA  2. Right testicular pain - US SCROTUM; Future - C. trachomatis/N. gonorrhoeae RNA  3. Chronic constipation - this appears well controlled with Miralax and not likely the source of his pain   Patient education and anticipatory guidance given Patient agrees with treatment plan Follow-up in 1 week with Sports Medicine for trigger point injection or sooner as needed if symptoms worsen or fail to improve  Darlyne Russian PA-C

## 2017-10-18 LAB — C. TRACHOMATIS/N. GONORRHOEAE RNA
C. trachomatis RNA, TMA: NOT DETECTED
N. gonorrhoeae RNA, TMA: NOT DETECTED

## 2017-10-23 ENCOUNTER — Ambulatory Visit (INDEPENDENT_AMBULATORY_CARE_PROVIDER_SITE_OTHER): Payer: Self-pay

## 2017-10-23 ENCOUNTER — Other Ambulatory Visit: Payer: Self-pay | Admitting: Physician Assistant

## 2017-10-23 DIAGNOSIS — N50811 Right testicular pain: Secondary | ICD-10-CM

## 2017-10-23 NOTE — Progress Notes (Signed)
Good afternoon,  Ultrasound was normal  [employer], [employer]

## 2017-10-24 ENCOUNTER — Encounter: Payer: Self-pay | Admitting: Sports Medicine

## 2017-10-24 ENCOUNTER — Ambulatory Visit (INDEPENDENT_AMBULATORY_CARE_PROVIDER_SITE_OTHER): Payer: Self-pay | Admitting: Sports Medicine

## 2017-10-24 DIAGNOSIS — K4090 Unilateral inguinal hernia, without obstruction or gangrene, not specified as recurrent: Secondary | ICD-10-CM

## 2017-10-24 NOTE — Patient Instructions (Signed)
Hernia inguinal en los adultos (Inguinal Hernia, Adult) Una hernia inguinal se produce cuando la grasa o los intestinos empujan a travs de la zona donde las piernas se unen al abdomen inferior (ingle) y forman una protuberancia redondeada (bulto). Esta afeccin se desarrolla con el transcurso del tiempo. Existen tres tipos de hernias inguinales. Estos tipos son los siguientes:  Hernias que se pueden empujar hacia adentro del abdomen (son reducibles).  Hernias que no son reducibles (estn encarceladas).  Hernias que no son reducibles y pierden la irrigacin sangunea (estn estranguladas). Este tipo de hernia requiere ciruga de Freight forwarder. CAUSAS Esta afeccin se produce cuando tiene una zona dbil en los msculos o en los tejidos. Esta zona dbil permite que la hernia la atraviese. Los factores desencadenantes de esta afeccin son los siguientes:  Un esfuerzo repentino de los msculos del abdomen inferior.  Levantar objetos pesados.  Dificultad para defecar. La dificultad para defecar (estreimiento) puede producir una hernia.  Tos. FACTORES DE RIESGO Es ms probable que esta afeccin se manifieste en:  Hombres.  [place].  Personas que: ? Tienen sobrepeso. ? Realizan trabajos que requieren Haematologist de pie durante largos perodos o levantar cargas pesadas. ? Dillard [place] tenido una hernia inguinal previamente. ? Fumar o [employer]. Estos factores pueden causar una tos duradera (crnica). SNTOMAS Los sntomas dependen del tamao de la hernia. Con frecuencia, una pequea hernia inguinal no tiene sntomas. Los sntomas de una hernia ms grande incluyen los siguientes:  Un bulto en la ingle. Este es fcil de ver cuando la persona se encuentra de pie. Podra no ser visible si la persona se encuentra acostada.  Dolor o ardor en la ingle. Esto ocurre especialmente al levantar cargas, realizar esfuerzos o toser.  Un dolor sordo o una sensacin de  presin en la ingle.  Un bulto en el escroto del hombre. Los sntomas de una hernia inguinal estrangulada pueden incluir lo siguiente:  Un bulto en la ingle que es muy doloroso y sensible al tacto.  Un bulto que se torna de color rojo o prpura.  Cristy Hilts, nuseas y vmitos.  Imposibilidad de defecar o eliminar gases. DIAGNSTICO Esta afeccin se diagnostica mediante la historia clnica y un examen fsico. El mdico puede examinar la zona de la ingle y pedirle que tosa. TRATAMIENTO El tratamiento de esta afeccin depende del tamao de la hernia y si tiene sntomas. Si no tiene sntomas, el mdico podr indicarle que controle cuidadosamente la hernia y que concurra a todas las visitas de control. Si la hernia es ms grande o si tiene sntomas, el tratamiento puede incluir la [place]. INSTRUCCIONES PARA EL CUIDADO EN EL HOGAR Estilo de vida  Beba suficiente lquido para mantener la orina clara o de color amarillo plido.  Lleve una dieta con alto contenido de [place]. Coma gran cantidad de frutas, verduras y cereales integrales. Hable con su mdico si tiene dudas.  Evite levantar objetos pesados.  Intente no estar de pie [employer].  No consuma productos que contengan tabaco, incluidos cigarrillos, tabaco de Higher education careers adviser o cigarrillos electrnicos. Si necesita ayuda para dejar de fumar, consulte al mdico.  Mantenga un peso saludable. Instrucciones generales  No trate de volver a introducir la hernia a la fuerza.  Observe si la hernia cambia de color o de tamao. Informe a su mdico si ocurre algn cambio.  Tome los medicamentos de venta libre y los recetados solamente como se lo haya indicado el mdico.  Concurra a todas las visitas  de control como se lo haya indicado el mdico. Esto es importante. SOLICITE ATENCIN MDICA SI:  Jaclynn Guarneri.  Aparecen nuevos sntomas.  Los sntomas empeoran. SOLICITE ATENCIN MDICA DE INMEDIATO SI:  Tiene dolor en la ingle que comienza  repentinamente y [place].  Un bulto en la ingle que crece rpidamente y no desaparece.  Si es un hombre y tiene Clinical cytogeneticist repentino en el escroto o el tamao de este cambia de repente.  Un bulto en la zona de la ingle que se vuelve rojo o prpura y causa dolor al tacto.  Tiene nuseas o vmitos que no desaparecen.  Siente que el corazn late mucho ms rpido que lo normal.  Tiene dificultad para defecar o eliminar gases. Esta informacin no tiene Marine scientist el consejo del mdico. Asegrese de hacerle al mdico cualquier pregunta que tenga. Document Released: 01/05/2013 Document Revised: 01/02/2016 Document Reviewed: 07/21/2014 Elsevier Interactive Patient Education  [employer].

## 2017-10-24 NOTE — Progress Notes (Signed)
Subjective:    I'm seeing this patient as a consultation for: Blake Chimes, PA-C  CC: Right anterior and lower abdomen pain  HPI: This is a pleasant 37 year old male, he is a Theme park manager.  For the past 6 years he has had on and off pain that he localizes in his right lower quadrant, he remembers an episode of trying to lift something heavy, feeling a tearing sensation in his right lower abdomen with radiation down to the right hemiscrotum.  Since then he has had intermittent tightness, and a tearing sort of sensation when he strains, Valsalva's, or lifts something heavy, and when this happens he feels a sensation in the right lower quadrant with radiation to the right scrotum, occasionally causing some numbness and tingling in the right scrotum and testicle.  He did have a scrotal ultrasound that showed for the most part nothing with the exception of a borderline bilateral hydrocele.  No varicocele.  I reviewed the past medical history, family history, social history, surgical history, and allergies today and no changes were needed.  Please see the problem list section below in epic for further details.  Past Medical History: Past Medical History:  Diagnosis Date  . History of traumatic injury to musculoskeletal system    fall 18 feet  . Hyperlipidemia   . 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 Low serum testosterone 07/10/2017   Past Surgical History: Past Surgical History:  Procedure Laterality Date  . WRIST SURGERY     Social History: Social History   Socioeconomic History  . Marital status: Married    Spouse name: None  . Number of children: None  . Years of education: None  . Highest education level: None  Social Needs  . Financial resource strain: None  . Food insecurity - worry: None  . Food insecurity - inability: None  . Transportation needs - medical: None  . Transportation needs - non-medical: None  Occupational History  . None  Tobacco Use  . Smoking status:  Never Smoker  . Smokeless tobacco: Never Used  Substance and Sexual Activity  . Alcohol use: No  . Drug use: No  . Sexual activity: Yes  Other Topics Concern  . None  Social History Narrative  . None   Family History: Family History  Problem Relation Age of Onset  . Hypertension Mother   . Diabetes Mother   . Alcohol abuse Father   . Hypertension Sister   . Diabetes Sister   . Hypertension Sister   . Diabetes Sister   . Other Neg Hx        hyperprolactinemia   Allergies: No Known Allergies Medications: See med rec.  Review of Systems: No headache, visual changes, nausea, vomiting, diarrhea, constipation, dizziness, abdominal pain, skin rash, fevers, chills, night sweats, weight loss, swollen lymph nodes, body aches, joint swelling, muscle aches, chest pain, shortness of breath, mood changes, visual or auditory hallucinations.   Objective:   General: Well Developed, well nourished, and in no acute distress.  Neuro:  Extra-ocular muscles intact, able to move all 4 extremities, sensation grossly intact.  Deep tendon reflexes tested were normal. Psych: Alert and oriented, mood congruent with affect. ENT:  Ears and nose appear unremarkable.  Hearing grossly normal. Neck: Unremarkable overall appearance, trachea midline.  No visible thyroid enlargement. Eyes: Conjunctivae and lids appear unremarkable.  Pupils equal and round. Skin: Warm and dry, no rashes noted.  Cardiovascular: Pulses palpable, no extremity edema. Abdomen: Soft, nontender, nondistended, normal bowel sounds, no palpable  masses, no guarding, rigidity, rebound tenderness, no costovertebral angle pain.   Genital: He does have some tenderness in the right internal inguinal ring, I am able to feel a palpable bulge that is tender to the patient when he is asked to cough during palpation of the internal ring.  No palpable varicocele, no palpable hydrocele, no penile discharge, no testicular masses or  tenderness.  Impression and Recommendations:   This case required medical decision making of moderate complexity.  Inguinal hernia, right Negative scrotal ultrasound with the exception of borderline hydrocele bilaterally. I can feel a right-sided inguinal hernia at the deep ring with Valsalva. His paresthesias in the right testicle likely due to ilioinguinal nerve impingement from his hernia. Referral to general surgery.  ___________________________________________ Gwen Her. Dianah Field, M.D., ABFM., CAQSM. Primary Care and [place] Instructor of [place] of [facility] of Medicine

## 2017-10-24 NOTE — Assessment & Plan Note (Addendum)
Negative scrotal ultrasound with the exception of borderline hydrocele bilaterally. I can feel a right-sided inguinal hernia at the deep ring with Valsalva. His paresthesias in the right testicle likely due to ilioinguinal nerve impingement from his hernia. Referral to general surgery.

## 2017-11-19 ENCOUNTER — Other Ambulatory Visit: Payer: Self-pay | Admitting: Physician Assistant

## 2017-12-02 ENCOUNTER — Other Ambulatory Visit: Payer: Self-pay

## 2017-12-02 MED ORDER — LOVASTATIN 10 MG PO TABS: 10.0000 mg | ORAL_TABLET | Freq: Every day | ORAL | 0 refills | Status: DC

## 2018-01-15 ENCOUNTER — Encounter: Payer: Self-pay | Admitting: Physician Assistant

## 2018-01-15 ENCOUNTER — Ambulatory Visit (INDEPENDENT_AMBULATORY_CARE_PROVIDER_SITE_OTHER): Payer: Self-pay | Admitting: Physician Assistant

## 2018-01-15 VITALS — BP 126/79 | HR 52 | Wt 160.0 lb

## 2018-01-15 DIAGNOSIS — M25511 Pain in right shoulder: Secondary | ICD-10-CM

## 2018-01-15 DIAGNOSIS — M19012 Primary osteoarthritis, left shoulder: Secondary | ICD-10-CM

## 2018-01-15 DIAGNOSIS — D352 Benign neoplasm of pituitary gland: Secondary | ICD-10-CM

## 2018-01-15 DIAGNOSIS — E229 Hyperfunction of pituitary gland, unspecified: Secondary | ICD-10-CM

## 2018-01-15 DIAGNOSIS — M19011 Primary osteoarthritis, right shoulder: Secondary | ICD-10-CM

## 2018-01-15 MED ORDER — CELECOXIB 100 MG PO CAPS: 100.0000 mg | ORAL_CAPSULE | Freq: Two times a day (BID) | ORAL | 2 refills | Status: DC | PRN

## 2018-01-15 MED ORDER — TRAMADOL HCL 50 MG PO TABS: 50.0000 mg | ORAL_TABLET | Freq: Three times a day (TID) | ORAL | 0 refills | Status: DC | PRN

## 2018-01-15 NOTE — Progress Notes (Signed)
HPI:                                                                Blake Thornton is a 37 y.o. male who presents to [facility]: [facility] today for shoulder pain  Spanish speaking 37 yo M, accompanied by partner who is acting as interpreter.   Patient with osteoarthritis of b/l shoulders presents with worsening right shoulder pain for the last 2 weeks. States he feels "like I tore something." Joint clicks and makes noises. He has persistent anterior shoulder pain radiating to the tricep and Diclofenac gel, Meloxicam and TENS unit not helping. He has not been to PT in 4 months.  He is being followed by endocrinology for pituitary microadenoma and hypogonadism. Doing well on Bromocriptine. Reports negative experience with the Endocrinologist they were seeing and they would like to go somewhere else. They are trying to get pregnant.  Depression screen PHQ 2/9 07/03/2017  Decreased Interest 0  Down, Depressed, Hopeless 0  PHQ - 2 Score 0    No flowsheet data found.    Past Medical History:  Diagnosis Date  . History of traumatic injury to musculoskeletal system    fall 18 feet  . Hyperlipidemia   . 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 Low serum testosterone 07/10/2017   Past Surgical History:  Procedure Laterality Date  . WRIST SURGERY     Social History   Tobacco Use  . Smoking status: Never Smoker  . Smokeless tobacco: Never Used  Substance Use Topics  . Alcohol use: No   family history includes Alcohol abuse in his father; Diabetes in his mother, sister, and sister; Hypertension in his mother, sister, and sister.    ROS: negative except as noted in the HPI  Medications: Current Outpatient Medications  Medication Sig Dispense Refill  . bromocriptine (PARLODEL) 2.5 MG tablet Take 1 tablet (2.5 mg total) at bedtime by mouth. 30 tablet 5  . diclofenac sodium (VOLTAREN) 1 % GEL Apply 2 g topically 4 (four) times daily. To  affected joint. 100 g 11  . lovastatin (MEVACOR) 10 MG tablet Take 1 tablet (10 mg total) by mouth daily. 90 tablet 0  . meloxicam (MOBIC) 15 MG tablet One tab PO qAM with breakfast for 2 weeks, then daily prn pain. 30 tablet 3  . Polyethylene Glycol 3350 (MIRALAX PO) Take by mouth.     No current facility-administered medications for this visit.    No Known Allergies     Objective:  BP 126/79   Pulse (!) 52   Wt 160 lb (72.6 kg)   BMI 25.82 kg/m  Gen:  alert, not ill-appearing, no distress, appropriate for age 79: head normocephalic without obvious abnormality, conjunctiva and cornea clear, trachea midline Pulm: Normal work of breathing, normal phonation Neuro: alert and oriented x 3, no tremor MSK: extremities atraumatic, normal gait and station Right shoulder: atraumatic, no a/c joint tenderness, no crepitus, negative Neer's, positive Hawkin's and lift-off, strength intact Skin: intact, no rashes on exposed skin, no jaundice, no cyanosis Psych: well-groomed, cooperative, good eye contact, euthymic mood, affect mood-congruent, speech is articulate, and thought processes clear and goal-directed    No results found for this or any previous visit (from the past 72 hour(s)).  No results found.    Assessment and Plan: 37 y.o. male with   1. Pituitary microadenoma with hyperprolactinemia ([place]) - patient and his wife were not pleased with care from Dr. Loanne Drilling and requesting new referral for continued surveillance of his Bromocriptine and infertility - Ambulatory referral to Endocrinology  2. Primary osteoarthritis of both shoulders - acute on chronic right shoulder pain with positive lift-off and Hawkins suspicious for rotator cuff pathology. Switching from Meloxicam to Celebrex, cont Diclofenac gel, encouraged him to re-start formal PT, follow-up with Sports Medicine at earliest convenience    Patient education and anticipatory guidance given Patient agrees with  treatment plan Follow-up as needed if symptoms worsen or fail to improve  Darlyne Russian PA-C

## 2018-01-17 ENCOUNTER — Ambulatory Visit (INDEPENDENT_AMBULATORY_CARE_PROVIDER_SITE_OTHER): Payer: Self-pay | Admitting: Sports Medicine

## 2018-01-17 ENCOUNTER — Encounter: Payer: Self-pay | Admitting: Sports Medicine

## 2018-01-17 DIAGNOSIS — M19012 Primary osteoarthritis, left shoulder: Secondary | ICD-10-CM

## 2018-01-17 DIAGNOSIS — M19011 Primary osteoarthritis, right shoulder: Secondary | ICD-10-CM

## 2018-01-17 MED ORDER — CELECOXIB 200 MG PO CAPS: 200.0000 mg | ORAL_CAPSULE | Freq: Every day | ORAL | 3 refills | Status: DC

## 2018-01-17 NOTE — Assessment & Plan Note (Signed)
Left shoulder is doing okay, refilling Celebrex at 200 mg. Right shoulder is hurting, glenohumeral injection today, previous injection was 5 months ago. Return as needed.

## 2018-01-17 NOTE — Progress Notes (Signed)
Subjective:    CC: Follow-up  HPI: This is a pleasant 37 year old male, I seen him 5 months ago for bilateral shoulder osteoarthritis, we did bilateral injections and he did well.  Unfortunately he starting to have a recurrence of pain on the right.  Left side is only minimally sore.  Not taking any anti-inflammatories though he was prescribed Celebrex recently.  Symptoms are mild, worsening on the right.  Localized over the anterior and posterior shoulder.  I reviewed the past medical history, family history, social history, surgical history, and allergies today and no changes were needed.  Please see the problem list section below in epic for further details.  Past Medical History: Past Medical History:  Diagnosis Date  . History of traumatic injury to musculoskeletal system    fall 18 feet  . Hyperlipidemia   . 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 Low serum testosterone 07/10/2017   Past Surgical History: Past Surgical History:  Procedure Laterality Date  . WRIST SURGERY     Social History: Social History   Socioeconomic History  . Marital status: Married    Spouse name: Not on file  . Number of children: Not on file  . Years of education: Not on file  . Highest education level: Not on file  Occupational History  . Not on file  Social Needs  . Financial resource strain: Not on file  . Food insecurity:    Worry: Not on file    Inability: Not on file  . Transportation needs:    Medical: Not on file    Non-medical: Not on file  Tobacco Use  . Smoking status: Never Smoker  . Smokeless tobacco: Never Used  Substance and Sexual Activity  . Alcohol use: No  . Drug use: No  . Sexual activity: Yes  Lifestyle  . Physical activity:    Days per week: Not on file    Minutes per session: Not on file  . Stress: Not on file  Relationships  . Social connections:    Talks on phone: Not on file    Gets together: Not on file    Attends religious service: Not on file   Active member of club or organization: Not on file    Attends meetings of clubs or organizations: Not on file    Relationship status: Not on file  Other Topics Concern  . Not on file  Social History Narrative  . Not on file   Family History: Family History  Problem Relation Age of Onset  . Hypertension Mother   . Diabetes Mother   . Alcohol abuse Father   . Hypertension Sister   . Diabetes Sister   . Hypertension Sister   . Diabetes Sister   . Other Neg Hx        hyperprolactinemia   Allergies: No Known Allergies Medications: See med rec.  Review of Systems: No fevers, chills, night sweats, weight loss, chest pain, or shortness of breath.   Objective:    General: Well Developed, well nourished, and in no acute distress.  Neuro: Alert and oriented x3, extra-ocular muscles intact, sensation grossly intact.  HEENT: Normocephalic, atraumatic, pupils equal round reactive to light, neck supple, no masses, no lymphadenopathy, thyroid nonpalpable.  Skin: Warm and dry, no rashes. Cardiac: Regular rate and rhythm, no murmurs rubs or gallops, no lower extremity edema.  Respiratory: Clear to auscultation bilaterally. Not using accessory muscles, speaking in full sentences. Right shoulder: Inspection reveals no abnormalities, atrophy or asymmetry. Palpation is normal  with no tenderness over AC joint or bicipital groove. ROM is full in all planes. Rotator cuff strength normal throughout. No signs of impingement with negative Neer and Hawkin's tests, empty can. Speeds and Yergason's tests normal. Pain with abduction and external rotation at the joint line Normal scapular function observed. No painful arc and no drop arm sign. No apprehension sign  Procedure: Real-time Ultrasound Guided Injection of right glenohumeral joint Device: GE Logiq E  Verbal informed consent obtained.  Time-out conducted.  Noted no overlying erythema, induration, or other signs of local infection.  Skin  prepped in a sterile fashion.  Local anesthesia: Topical Ethyl chloride.  With sterile technique and under real time ultrasound guidance: Using a 22-gauge spinal needle I advanced into the joint from a posterior approach and injected 1 cc kenalog 40, 2 cc lidocaine, 2 cc bupivacaine. Completed without difficulty. Pain immediately resolved suggesting accurate placement of the medication.  Advised to call if fevers/chills, erythema, induration, drainage, or persistent bleeding.  Images permanently stored and available for review in the ultrasound unit.  Impression: Technically successful ultrasound guided injection.  Impression and Recommendations:    Primary osteoarthritis of both shoulders Left shoulder is doing okay, refilling Celebrex at 200 mg. Right shoulder is hurting, glenohumeral injection today, previous injection was 5 months ago. Return as needed.  ___________________________________________ Gwen Her. Dianah Field, M.D., ABFM., CAQSM. Primary Care and [place] Instructor of [place] of [facility] of Medicine

## 2018-01-31 ENCOUNTER — Other Ambulatory Visit: Payer: Self-pay | Admitting: Endocrinology

## 2018-09-29 ENCOUNTER — Ambulatory Visit: Payer: Self-pay | Admitting: General Surgery

## 2018-09-29 NOTE — H&P (View-Only) (Signed)
  History of Present Illness Ralene Ok MD; 09/29/2018 10:38 AM) The patient is a 38 year old male who presents with an inguinal hernia. Chief complaint: right inguinal hernia  Patient is a 38 year old male who returns after approximately 1 year with a right inguinal pain and hernia. Patient states the pain is increased over time. 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 Patient noticed no increased signs or symptoms of incarceration or strangulation recently. ------------------------------------------------------- Patient is returning a 38 year old male, Spanish-speaking with a history of pituitary microadenoma, who comes in with a 5 year history of right inguinal pain. 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 He states that this pain is more so at the end the day. He states he has not noticed a bulge in the right inguinal area. He does state that he noticed symptoms that radiated down to his right groin and right inner thigh. Patient had no signs or symptoms of incarceration or strangulation. He's had no previous abdominal surgeries.    Allergies Lindwood Coke, RN; 09/29/2018 10:33 AM) No Known Drug Allergies [11/07/2017]: Allergies Reconciled   Medication History (Diane Herrin, RN; 09/29/2018 10:33 AM) Bromocriptine Mesylate (2.5MG  Tablet, Oral) Active. Medications Reconciled    Review of Systems Ralene Ok, MD; 09/29/2018 10:39 AM) Cardiovascular Not Present- Chest Pain, Difficulty Breathing Lying Down, Leg Cramps, Palpitations, Rapid Heart Rate, Shortness of Breath and Swelling of Extremities. Gastrointestinal Not Present- Abdominal Pain, Bloating, Bloody Stool, Change in Bowel Habits, Chronic diarrhea, Constipation, Difficulty Swallowing, Excessive gas, Gets full quickly at meals, Hemorrhoids, Indigestion, Nausea, Rectal Pain and Vomiting. Male Genitourinary Present- Frequency and Impotence.  Not Present- Blood in Urine, Change in Urinary Stream, Nocturia, Painful Urination, Urgency and Urine Leakage. All other systems negative  Vitals (Diane Herrin RN; 09/29/2018 10:34 AM) 09/29/2018 10:33 AM Weight: 160 lb Height: 64in Body Surface Area: 1.78 m Body Mass Index: 27.46 kg/m  Temp.: 98.22F(Oral)  Pulse: 60 (Regular)  P.OX: 99% (Room air) BP: 128/84 (Sitting, Left Arm, Standard)       Physical Exam Ralene Ok, MD; 09/29/2018 10:39 AM) The physical exam findings are as follows: Note: Constitutional: No acute distress, conversant, appears stated age  Eyes: Anicteric sclerae, moist conjunctiva, no lid lag  Neck: No thyromegaly, trachea midline, no cervical lymphadenopathy  Lungs: Clear to auscultation biilaterally, normal respiratory effot  Cardiovascular: regular rate & rhythm, no murmurs, no peripheal edema, pedal pulses 2+  GI: Soft, no masses or hepatosplenomegaly, non-tender to palpation  MSK: Normal gait, no clubbing cyanosis, edema  Skin: No rashes, palpation reveals normal skin turgor  Psychiatric: Appropriate judgment and insight, oriented to person, place, and time  Abdomen Inspection Hernias - Inguinal hernia - Right - Reducible(Small).    Assessment & Plan Ralene Ok MD; 09/29/2018 10:39 AM) RIGHT INGUINAL HERNIA (K40.90) Impression: 38 year old male with a primary right inguinal hernia  1. The patient will like to proceed to the operating room for laparoscopic right inguinal hernia repair with mesh.  2. I discussed with the patient the signs and symptoms of incarceration and strangulation and the need to proceed to the ER should they occur.  3. I discussed with the patient the risks and benefits of the procedure to include but not limited to: Infection, bleeding, damage to surrounding structures, possible need for further surgery, possible nerve pain, and possible recurrence. The patient was understanding and wishes to  proceed.

## 2018-09-29 NOTE — H&P (Signed)
  History of Present Illness Blake Ok MD; 09/29/2018 10:38 AM) The patient is a 38 year old male who presents with an inguinal hernia. Chief complaint: right inguinal hernia  Patient is a 38 year old male who returns after approximately 1 year with a right inguinal pain and hernia. Patient states the pain is increased over time. 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 Patient noticed no increased signs or symptoms of incarceration or strangulation recently. ------------------------------------------------------- Patient is returning a 38 year old male, Spanish-speaking with a history of pituitary microadenoma, who comes in with a 5 year history of right inguinal pain. 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 He states that this pain is more so at the end the day. He states he has not noticed a bulge in the right inguinal area. He does state that he noticed symptoms that radiated down to his right groin and right inner thigh. Patient had no signs or symptoms of incarceration or strangulation. He's had no previous abdominal surgeries.    Allergies Lindwood Coke, RN; 09/29/2018 10:33 AM) No Known Drug Allergies [11/07/2017]: Allergies Reconciled   Medication History (Diane Herrin, RN; 09/29/2018 10:33 AM) Bromocriptine Mesylate (2.5MG  Tablet, Oral) Active. Medications Reconciled    Review of Systems Blake Ok, MD; 09/29/2018 10:39 AM) Cardiovascular Not Present- Chest Pain, Difficulty Breathing Lying Down, Leg Cramps, Palpitations, Rapid Heart Rate, Shortness of Breath and Swelling of Extremities. Gastrointestinal Not Present- Abdominal Pain, Bloating, Bloody Stool, Change in Bowel Habits, Chronic diarrhea, Constipation, Difficulty Swallowing, Excessive gas, Gets full quickly at meals, Hemorrhoids, Indigestion, Nausea, Rectal Pain and Vomiting. Male Genitourinary Present- Frequency and Impotence.  Not Present- Blood in Urine, Change in Urinary Stream, Nocturia, Painful Urination, Urgency and Urine Leakage. All other systems negative  Vitals (Diane Herrin RN; 09/29/2018 10:34 AM) 09/29/2018 10:33 AM Weight: 160 lb Height: 64in Body Surface Area: 1.78 m Body Mass Index: 27.46 kg/m  Temp.: 98.31F(Oral)  Pulse: 60 (Regular)  P.OX: 99% (Room air) BP: 128/84 (Sitting, Left Arm, Standard)       Physical Exam Blake Ok, MD; 09/29/2018 10:39 AM) The physical exam findings are as follows: Note: Constitutional: No acute distress, conversant, appears stated age  Eyes: Anicteric sclerae, moist conjunctiva, no lid lag  Neck: No thyromegaly, trachea midline, no cervical lymphadenopathy  Lungs: Clear to auscultation biilaterally, normal respiratory effot  Cardiovascular: regular rate & rhythm, no murmurs, no peripheal edema, pedal pulses 2+  GI: Soft, no masses or hepatosplenomegaly, non-tender to palpation  MSK: Normal gait, no clubbing cyanosis, edema  Skin: No rashes, palpation reveals normal skin turgor  Psychiatric: Appropriate judgment and insight, oriented to person, place, and time  Abdomen Inspection Hernias - Inguinal hernia - Right - Reducible(Small).    Assessment & Plan Blake Ok MD; 09/29/2018 10:39 AM) RIGHT INGUINAL HERNIA (K40.90) Impression: 38 year old male with a primary right inguinal hernia  1. The patient will like to proceed to the operating room for laparoscopic right inguinal hernia repair with mesh.  2. I discussed with the patient the signs and symptoms of incarceration and strangulation and the need to proceed to the ER should they occur.  3. I discussed with the patient the risks and benefits of the procedure to include but not limited to: Infection, bleeding, damage to surrounding structures, possible need for further surgery, possible nerve pain, and possible recurrence. The patient was understanding and wishes to  proceed.

## 2018-10-03 NOTE — Pre-Procedure Instructions (Signed)
Blake Thornton  10/03/2018      [id number] - [place], [address] [place] [address] Phone: [phone] Fax: [phone]    Your procedure is scheduled on January 14th.  Report to [facility] Admitting at 10:15 A.M.  Call this number if you have problems the morning of surgery:  [phone]   Remember:  Do not eat or drink after midnight.     7 days prior to surgery STOP taking any Aspirin (unless otherwise instructed by your surgeon), Aleve, Naproxen, Ibuprofen, Motrin, Advil, Goody's, BC's, all herbal medications, fish oil, and all vitamins.     Do not wear jewelry.  Do not wear lotions, powders, or perfumes, or deodorant.  Do not shave 48 hours prior to surgery.  Men may shave face and neck.  Do not bring valuables to the hospital.  [facility] is not responsible for any belongings or valuables.   [facility]- Preparing For Surgery  Before surgery, you can play an important role. Because skin is not sterile, your skin needs to be as free of germs as possible. You can reduce the number of germs on your skin by washing with CHG (chlorahexidine gluconate) Soap before surgery.  CHG is an antiseptic cleaner which kills germs and bonds with the skin to continue killing germs even after washing.    Oral Hygiene is also important to reduce your risk of infection.  Remember - BRUSH YOUR TEETH THE MORNING OF SURGERY WITH YOUR REGULAR TOOTHPASTE  Please do not use if you have an allergy to CHG or antibacterial soaps. If your skin becomes reddened/irritated stop using the CHG.  Do not shave (including legs and underarms) for at least 48 hours prior to first CHG shower. It is OK to shave your face.  Please follow these instructions carefully.   1. Shower the NIGHT BEFORE SURGERY and the MORNING OF SURGERY with CHG.   2. If you chose to wash your hair, wash your hair first as usual with your normal  shampoo.  3. After you shampoo, rinse your hair and body thoroughly to remove the shampoo.  4. Use CHG as you would any other liquid soap. You can apply CHG directly to the skin and wash gently with a scrungie or a clean washcloth.   5. Apply the CHG Soap to your body ONLY FROM THE NECK DOWN.  Do not use on open wounds or open sores. Avoid contact with your eyes, ears, mouth and genitals (private parts). Wash Face and genitals (private parts)  with your normal soap.  6. Wash thoroughly, paying special attention to the area where your surgery will be performed.  7. Thoroughly rinse your body with warm water from the neck down.  8. DO NOT shower/wash with your normal soap after using and rinsing off the CHG Soap.  9. Pat yourself dry with a CLEAN TOWEL.  10. Wear CLEAN PAJAMAS to bed the night before surgery, wear comfortable clothes the morning of surgery  11. Place CLEAN SHEETS on your bed the night of your first shower and DO NOT SLEEP WITH PETS.  Day of Surgery:  Do not apply any deodorants/lotions.  Please wear clean clothes to the hospital/surgery center.   Remember to brush your teeth WITH YOUR REGULAR TOOTHPASTE.   Contacts, dentures or bridgework may not be worn into surgery.  Leave your suitcase in the car.  After surgery it may be brought to your room.  For patients admitted to the hospital, discharge time will be determined by your treatment team.  Patients discharged the day of surgery will not be allowed to drive home.

## 2018-10-06 ENCOUNTER — Encounter (HOSPITAL_COMMUNITY): Payer: Self-pay

## 2018-10-06 ENCOUNTER — Other Ambulatory Visit: Payer: Self-pay

## 2018-10-06 ENCOUNTER — Inpatient Hospital Stay (HOSPITAL_COMMUNITY)
Admission: RE | Admit: 2018-10-06 | Discharge: 2018-10-06 | Disposition: A | Discharge status: Home / Self Care | Payer: Self-pay | Source: Ambulatory Visit

## 2018-10-06 ENCOUNTER — Encounter (HOSPITAL_COMMUNITY)
Admission: RE | Admit: 2018-10-06 | Discharge: 2018-10-06 | Disposition: A | Discharge status: Home / Self Care | Payer: Self-pay | Source: Ambulatory Visit | Attending: General Surgery | Admitting: General Surgery

## 2018-10-06 DIAGNOSIS — Z01812 Encounter for preprocedural laboratory examination: Secondary | ICD-10-CM | POA: Insufficient documentation

## 2018-10-06 HISTORY — DX: Gastro-esophageal reflux disease without esophagitis: K21.9

## 2018-10-06 LAB — CBC
HCT: 44.1 % (ref 39.0–52.0)
Hemoglobin: 14.3 g/dL (ref 13.0–17.0)
MCH: 27.6 pg (ref 26.0–34.0)
MCHC: 32.4 g/dL (ref 30.0–36.0)
MCV: 85 fL (ref 80.0–100.0)
Platelets: 285 10*3/uL (ref 150–400)
RBC: 5.19 MIL/uL (ref 4.22–5.81)
RDW: 12.8 % (ref 11.5–15.5)
WBC: 7 10*3/uL (ref 4.0–10.5)
nRBC: 0 % (ref 0.0–0.2)

## 2018-10-06 LAB — BASIC METABOLIC PANEL
ANION GAP: 6 (ref 5–15)
BUN: 11 mg/dL (ref 6–20)
CALCIUM: 9.1 mg/dL (ref 8.9–10.3)
CO2: 28 mmol/L (ref 22–32)
Chloride: 104 mmol/L (ref 98–111)
Creatinine, Ser: 0.76 mg/dL (ref 0.61–1.24)
GFR calc Af Amer: >60 mL/min (ref >60)
GFR calc non Af Amer: >60 mL/min (ref >60)
Glucose, Bld: 101 mg/dL — ABNORMAL HIGH (ref 70–99)
Potassium: 3.7 mmol/L (ref 3.5–5.1)
Sodium: 138 mmol/L (ref 135–145)

## 2018-10-06 NOTE — Progress Notes (Signed)
PCP - [place]andam Cardiologist - denies  Chest x-ray - denies  EKG - denies Stress Test - denies  ECHO - denies Cardiac Cath - denies  Sleep Study - denies CPAP - denies  Anesthesia review: No  Patient denies shortness of breath, fever, cough and chest pain at PAT appointment   Patient verbalized understanding of instructions that were given to them at the PAT appointment. Patient was also instructed that they will need to review over the PAT instructions again at home before surgery.  Marland Kitchen

## 2018-10-07 ENCOUNTER — Encounter (HOSPITAL_COMMUNITY)
Admission: RE | Disposition: A | Discharge status: Home / Self Care | Payer: Self-pay | Source: Home / Self Care | Attending: General Surgery

## 2018-10-07 ENCOUNTER — Ambulatory Visit (HOSPITAL_COMMUNITY)
Admission: RE | Admit: 2018-10-07 | Discharge: 2018-10-07 | Disposition: A | Discharge status: Home / Self Care | Payer: Self-pay | Attending: General Surgery | Admitting: General Surgery

## 2018-10-07 ENCOUNTER — Ambulatory Visit (HOSPITAL_COMMUNITY): Payer: Self-pay | Admitting: Certified Registered Nurse Anesthetist

## 2018-10-07 ENCOUNTER — Encounter (HOSPITAL_COMMUNITY): Payer: Self-pay | Admitting: Certified Registered Nurse Anesthetist

## 2018-10-07 DIAGNOSIS — D176 Benign lipomatous neoplasm of spermatic cord: Secondary | ICD-10-CM | POA: Insufficient documentation

## 2018-10-07 DIAGNOSIS — N529 Male erectile dysfunction, unspecified: Secondary | ICD-10-CM | POA: Insufficient documentation

## 2018-10-07 DIAGNOSIS — K4090 Unilateral inguinal hernia, without obstruction or gangrene, not specified as recurrent: Secondary | ICD-10-CM | POA: Insufficient documentation

## 2018-10-07 HISTORY — PX: INSERTION OF MESH: SHX5868

## 2018-10-07 HISTORY — PX: INGUINAL HERNIA REPAIR: SHX194

## 2018-10-07 SURGERY — REPAIR, HERNIA, INGUINAL, LAPAROSCOPIC
Anesthesia: General | Site: Inguinal | Laterality: Right

## 2018-10-07 MED ORDER — GABAPENTIN 300 MG PO CAPS
300.0000 mg | ORAL_CAPSULE | ORAL | Status: AC
  Administered 2018-10-07: 300 mg via ORAL
  Filled 2018-10-07: qty 1

## 2018-10-07 MED ORDER — CHLORHEXIDINE GLUCONATE CLOTH 2 % EX PADS: 6.0000 | MEDICATED_PAD | Freq: Once | CUTANEOUS | Status: DC

## 2018-10-07 MED ORDER — MIDAZOLAM HCL 2 MG/2ML IJ SOLN
INTRAMUSCULAR | Status: AC
  Filled 2018-10-07: qty 2

## 2018-10-07 MED ORDER — OXYCODONE HCL 5 MG PO TABS
ORAL_TABLET | ORAL | Status: AC
  Filled 2018-10-07: qty 1

## 2018-10-07 MED ORDER — LIDOCAINE 2% (20 MG/ML) 5 ML SYRINGE
INTRAMUSCULAR | Status: AC
  Filled 2018-10-07: qty 5

## 2018-10-07 MED ORDER — MIDAZOLAM HCL 5 MG/5ML IJ SOLN
INTRAMUSCULAR | Status: DC | PRN
  Administered 2018-10-07: 2 mg via INTRAVENOUS

## 2018-10-07 MED ORDER — ACETAMINOPHEN 500 MG PO TABS
1000.0000 mg | ORAL_TABLET | ORAL | Status: AC
  Administered 2018-10-07: 1000 mg via ORAL
  Filled 2018-10-07: qty 2

## 2018-10-07 MED ORDER — ONDANSETRON HCL 4 MG/2ML IJ SOLN
INTRAMUSCULAR | Status: DC | PRN
  Administered 2018-10-07: 4 mg via INTRAVENOUS

## 2018-10-07 MED ORDER — TRAMADOL HCL 50 MG PO TABS: 50.0000 mg | ORAL_TABLET | Freq: Four times a day (QID) | ORAL | 0 refills | Status: DC | PRN

## 2018-10-07 MED ORDER — DEXAMETHASONE SODIUM PHOSPHATE 10 MG/ML IJ SOLN
INTRAMUSCULAR | Status: DC | PRN
  Administered 2018-10-07: 10 mg via INTRAVENOUS

## 2018-10-07 MED ORDER — BUPIVACAINE HCL 0.25 % IJ SOLN
INTRAMUSCULAR | Status: DC | PRN
  Administered 2018-10-07: 30 mL

## 2018-10-07 MED ORDER — DEXAMETHASONE SODIUM PHOSPHATE 10 MG/ML IJ SOLN
INTRAMUSCULAR | Status: AC
  Filled 2018-10-07: qty 1

## 2018-10-07 MED ORDER — LIDOCAINE 2% (20 MG/ML) 5 ML SYRINGE
INTRAMUSCULAR | Status: DC | PRN
  Administered 2018-10-07: 60 mg via INTRAVENOUS

## 2018-10-07 MED ORDER — ONDANSETRON HCL 4 MG/2ML IJ SOLN: 4.0000 mg | Freq: Four times a day (QID) | INTRAMUSCULAR | Status: DC | PRN

## 2018-10-07 MED ORDER — PROPOFOL 10 MG/ML IV BOLUS
INTRAVENOUS | Status: AC
  Filled 2018-10-07: qty 20

## 2018-10-07 MED ORDER — ROCURONIUM BROMIDE 50 MG/5ML IV SOSY
PREFILLED_SYRINGE | INTRAVENOUS | Status: DC | PRN
  Administered 2018-10-07: 50 mg via INTRAVENOUS

## 2018-10-07 MED ORDER — OXYCODONE HCL 5 MG/5ML PO SOLN: 5.0000 mg | Freq: Once | ORAL | Status: AC | PRN

## 2018-10-07 MED ORDER — FENTANYL CITRATE (PF) 250 MCG/5ML IJ SOLN
INTRAMUSCULAR | Status: AC
  Filled 2018-10-07: qty 5

## 2018-10-07 MED ORDER — ROCURONIUM BROMIDE 50 MG/5ML IV SOSY
PREFILLED_SYRINGE | INTRAVENOUS | Status: AC
  Filled 2018-10-07: qty 10

## 2018-10-07 MED ORDER — FENTANYL CITRATE (PF) 100 MCG/2ML IJ SOLN
INTRAMUSCULAR | Status: DC | PRN
  Administered 2018-10-07 (×2): 50 ug via INTRAVENOUS

## 2018-10-07 MED ORDER — 0.9 % SODIUM CHLORIDE (POUR BTL) OPTIME
TOPICAL | Status: DC | PRN
  Administered 2018-10-07: 1000 mL

## 2018-10-07 MED ORDER — ONDANSETRON HCL 4 MG/2ML IJ SOLN
INTRAMUSCULAR | Status: AC
  Filled 2018-10-07: qty 2

## 2018-10-07 MED ORDER — LACTATED RINGERS IV SOLN
INTRAVENOUS | Status: DC | PRN
  Administered 2018-10-07: 12:00:00 via INTRAVENOUS

## 2018-10-07 MED ORDER — CEFAZOLIN SODIUM-DEXTROSE 2-4 GM/100ML-% IV SOLN
2.0000 g | INTRAVENOUS | Status: AC
  Administered 2018-10-07: 2 g via INTRAVENOUS
  Filled 2018-10-07: qty 100

## 2018-10-07 MED ORDER — FENTANYL CITRATE (PF) 100 MCG/2ML IJ SOLN: 25.0000 ug | INTRAMUSCULAR | Status: DC | PRN

## 2018-10-07 MED ORDER — SUGAMMADEX SODIUM 200 MG/2ML IV SOLN
INTRAVENOUS | Status: DC | PRN
  Administered 2018-10-07: 150 mg via INTRAVENOUS

## 2018-10-07 MED ORDER — PROPOFOL 10 MG/ML IV BOLUS
INTRAVENOUS | Status: DC | PRN
  Administered 2018-10-07: 150 mg via INTRAVENOUS

## 2018-10-07 MED ORDER — BUPIVACAINE HCL (PF) 0.25 % IJ SOLN
INTRAMUSCULAR | Status: AC
  Filled 2018-10-07: qty 30

## 2018-10-07 MED ORDER — OXYCODONE HCL 5 MG PO TABS
5.0000 mg | ORAL_TABLET | Freq: Once | ORAL | Status: AC | PRN
  Administered 2018-10-07: 5 mg via ORAL

## 2018-10-07 SURGICAL SUPPLY — 45 items
CANISTER SUCT 3000ML PPV (MISCELLANEOUS) IMPLANT
COVER SURGICAL LIGHT HANDLE (MISCELLANEOUS) ×4 IMPLANT
COVER WAND RF STERILE (DRAPES) ×4 IMPLANT
DEFOGGER SCOPE WARMER CLEARIFY (MISCELLANEOUS) IMPLANT
DERMABOND ADVANCED (GAUZE/BANDAGES/DRESSINGS) ×1
DERMABOND ADVANCED .7 DNX12 (GAUZE/BANDAGES/DRESSINGS) ×3 IMPLANT
DISSECTOR BLUNT TIP ENDO 5MM (MISCELLANEOUS) IMPLANT
ELECT REM PT RETURN 9FT ADLT (ELECTROSURGICAL) ×3
ELECTRODE REM PT RTRN 9FT ADLT (ELECTROSURGICAL) ×3 IMPLANT
ENDOLOOP SUT PDS II  0 18 (SUTURE) ×1
ENDOLOOP SUT PDS II 0 18 (SUTURE) ×1 IMPLANT
GLOVE BIO SURGEON STRL SZ 6.5 (GLOVE) ×6 IMPLANT
GLOVE BIO SURGEON STRL SZ7.5 (GLOVE) ×4 IMPLANT
GLOVE BIOGEL PI IND STRL 6 (GLOVE) ×1 IMPLANT
GLOVE BIOGEL PI INDICATOR 6 (GLOVE) ×1
GOWN STRL REUS W/ TWL LRG LVL3 (GOWN DISPOSABLE) ×7 IMPLANT
GOWN STRL REUS W/ TWL XL LVL3 (GOWN DISPOSABLE) ×3 IMPLANT
GOWN STRL REUS W/TWL LRG LVL3 (GOWN DISPOSABLE) ×3
GOWN STRL REUS W/TWL XL LVL3 (GOWN DISPOSABLE) ×1
KIT BASIN OR (CUSTOM PROCEDURE TRAY) ×4 IMPLANT
KIT TURNOVER KIT B (KITS) ×4 IMPLANT
MESH 3DMAX 5X7 RT XLRG (Mesh General) ×2 IMPLANT
NDL INSUFFLATION 14GA 120MM (NEEDLE) IMPLANT
NEEDLE INSUFFLATION 14GA 120MM (NEEDLE) IMPLANT
NS IRRIG 1000ML POUR BTL (IV SOLUTION) ×4 IMPLANT
PAD ARMBOARD 7.5X6 YLW CONV (MISCELLANEOUS) ×8 IMPLANT
RELOAD STAPLE 4.0 BLU F/HERNIA (INSTRUMENTS) ×2 IMPLANT
RELOAD STAPLE 4.8 BLK F/HERNIA (STAPLE) IMPLANT
RELOAD STAPLE HERNIA 4.0 BLUE (INSTRUMENTS) ×3 IMPLANT
RELOAD STAPLE HERNIA 4.8 BLK (STAPLE) IMPLANT
SCISSORS LAP 5X35 DISP (ENDOMECHANICALS) ×6 IMPLANT
SET IRRIG TUBING LAPAROSCOPIC (IRRIGATION / IRRIGATOR) IMPLANT
SET TUBE SMOKE EVAC HIGH FLOW (TUBING) ×4 IMPLANT
STAPLER HERNIA 12 8.5 360D (INSTRUMENTS) ×4 IMPLANT
SUT MNCRL AB 4-0 PS2 18 (SUTURE) ×4 IMPLANT
SUT VIC AB 1 CT1 27 (SUTURE)
SUT VIC AB 1 CT1 27XBRD ANBCTR (SUTURE) IMPLANT
SYRINGE TOOMEY DISP (SYRINGE) ×4 IMPLANT
TOWEL OR 17X24 6PK STRL BLUE (TOWEL DISPOSABLE) ×4 IMPLANT
TOWEL OR 17X26 10 PK STRL BLUE (TOWEL DISPOSABLE) ×4 IMPLANT
TRAY FOLEY W/BAG SLVR 14FR (SET/KITS/TRAYS/PACK) ×4 IMPLANT
TRAY LAPAROSCOPIC MC (CUSTOM PROCEDURE TRAY) ×4 IMPLANT
TROCAR CANNULA W/PORT DUAL 5MM (MISCELLANEOUS) ×4 IMPLANT
TROCAR XCEL 12X100 BLDLESS (ENDOMECHANICALS) ×4 IMPLANT
WATER STERILE IRR 1000ML POUR (IV SOLUTION) ×4 IMPLANT

## 2018-10-07 NOTE — Anesthesia Preprocedure Evaluation (Addendum)
Anesthesia Evaluation  Patient identified by MRN, date of birth, ID band Patient awake    Reviewed: Allergy & Precautions, H&P , NPO status , Patient's Chart, lab work & pertinent test results  Airway Mallampati: II   Neck ROM: full    Dental   Pulmonary neg pulmonary ROS,    breath sounds clear to auscultation       Cardiovascular negative cardio ROS   Rhythm:regular Rate:Normal     Neuro/Psych    GI/Hepatic GERD  ,  Endo/Other    Renal/GU      Musculoskeletal  (+) Arthritis ,   Abdominal   Peds  Hematology   Anesthesia Other Findings   Reproductive/Obstetrics                            Anesthesia Physical Anesthesia Plan  ASA: II  Anesthesia Plan: General   Post-op Pain Management:    Induction: Intravenous  PONV Risk Score and Plan: 2 and Ondansetron, Dexamethasone, Midazolam and Treatment may vary due to age or medical condition  Airway Management Planned: Oral ETT  Additional Equipment:   Intra-op Plan:   Post-operative Plan: Extubation in OR  Informed Consent: I have reviewed the patients History and Physical, chart, labs and discussed the procedure including the risks, benefits and alternatives for the proposed anesthesia with the patient or authorized representative who has indicated his/her understanding and acceptance.       Plan Discussed with: CRNA, Anesthesiologist and Surgeon  Anesthesia Plan Comments:         Anesthesia Quick Evaluation

## 2018-10-07 NOTE — Op Note (Signed)
10/07/2018  1:18 PM  PATIENT:  Blake Thornton  38 y.o. 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  PROCEDURE:  Procedure(s): LAPAROSCOPIC RIGHT  INGUINAL HERNIA WITH MESH (Right) INSERTION OF MESH (N/A)  SURGEON:  Surgeon(s) and Role:    Ralene Ok, MD - Primary  ANESTHESIA:   local and general  EBL:  minimal   BLOOD ADMINISTERED:none  DRAINS: none   LOCAL MEDICATIONS USED:  BUPIVICAINE   SPECIMEN:  No Specimen  DISPOSITION OF SPECIMEN:  N/A  COUNTS:  YES  TOURNIQUET:  * No tourniquets in log *  DICTATION: .Dragon Dictation   Counts: reported as correct x 2  Findings:  The patient had a small right indirect hernia and a moderated sized cord lipoma  Indications for procedure:  The patient is a 38 year old male with a right inguinal hernia for several months. Patient complained of symptomatology to his right inguinal area. The patient was taken back for elective inguinal hernia repair.  Details of the procedure: The patient was taken back to the operating room. The patient was placed in supine position with bilateral SCDs in place.  The patient was prepped and draped in the usual sterile fashion.  After appropriate anitbiotics were confirmed, a time-out was confirmed and all facts were verified.  0.25% Marcaine was used to infiltrate the umbilical area. A 11-blade was used to cut down the skin and blunt dissection was used to get the anterior fashion.  The anterior fascia was incised approximately 1 cm and the muscles were retracted laterally. Blunt dissection was then used to create a space in the preperitoneal area. At this time a 10 mm camera was then introduced into the space and advanced the pubic tubercle and a 12 mm trocar was placed over this and insufflation was started.  At this time and space was created from medial to laterally the preperitoneal space.  Cooper's  ligament was initially cleaned off.  The hernia sac was identified in the indirect space. Dissection of the hernia sac was undertaken the vas deferens was identified and protected in all parts of the case.  There was a small tear into the hernia sac. The tear was closed using a 0 Endoloop x 1.  Once the hernia sac was taken down to approximately the umbilicus a Bard 3D Max mesh, size: Rachelle Hora, was  introduced into the preperitoneal space.  The mesh was brought over to cover the direct and indirect hernia spaces.  This was anchored into place and secured to Cooper's ligament with 4.29mm staples from a Coviden hernia stapler. It was anchored to the anterior abdominal wall with 4.8 mm staples. The hernia sac was seen lying posterior to the mesh. There was no staples placed laterally. The insufflation was evacuated and the peritoneum was seen posterior to the mesh. The trochars were removed. The anterior fascia was reapproximated using #1 Vicryl on a UR- 6.  Intra-abdominal air was evacuated and the Veress needle removed. The skin was reapproximated using 4-0 Monocryl subcuticular fashion and Dermabond. The patient was awakened from general anesthesia and taken to recovery in stable condition.   PLAN OF CARE: Discharge to home after PACU  PATIENT DISPOSITION:  PACU - hemodynamically stable.   Delay start of Pharmacological VTE agent (>24hrs) due to surgical blood loss or risk of bleeding: not applicable

## 2018-10-07 NOTE — Interval H&P Note (Signed)
History and Physical Interval Note:  10/07/2018 11:39 AM  Blake Thornton  has presented today for surgery, with the diagnosis of right inguinal hernia  The various methods of treatment have been discussed with the patient and family. After consideration of risks, benefits and other options for treatment, the patient has consented to  Procedure(s): LAPAROSCOPIC RIGHT  INGUINAL HERNIA WITH MESH (Right) INSERTION OF MESH (N/A) as a surgical intervention .  The patient's history has been reviewed, patient examined, no change in status, stable for surgery.  I have reviewed the patient's chart and labs.  Questions were answered to the patient's satisfaction.     Ralene Ok

## 2018-10-07 NOTE — Anesthesia Procedure Notes (Signed)
Procedure Name: Intubation Date/Time: 10/07/2018 12:30 PM Performed by: Candis Shine, CRNA Pre-anesthesia Checklist: Patient identified, Emergency Drugs available, Suction available and Patient being monitored Patient Re-evaluated:Patient Re-evaluated prior to induction Oxygen Delivery Method: Circle System Utilized Preoxygenation: Pre-oxygenation with 100% oxygen Induction Type: IV induction Ventilation: Mask ventilation without difficulty Laryngoscope Size: Mac and 4 Grade View: Grade I Tube type: Oral Tube size: 7.5 mm Number of attempts: 1 Airway Equipment and Method: Stylet Placement Confirmation: ETT inserted through vocal cords under direct vision,  positive ETCO2 and breath sounds checked- equal and bilateral Secured at: 22 cm Tube secured with: Tape Dental Injury: Teeth and Oropharynx as per pre-operative assessment

## 2018-10-07 NOTE — Transfer of Care (Signed)
Immediate Anesthesia Transfer of Care Note  Patient: Blake Thornton  Procedure(s) Performed: LAPAROSCOPIC RIGHT  INGUINAL HERNIA WITH MESH (Right Inguinal) INSERTION OF MESH (N/A Inguinal)  Patient Location: PACU  Anesthesia Type:General  Level of Consciousness: awake, alert  and oriented  Airway & Oxygen Therapy: Patient Spontanous Breathing and Patient connected to nasal cannula oxygen  Post-op Assessment: Report given to RN and Post -op Vital signs reviewed and stable  Post vital signs: Reviewed and stable  Last Vitals:  Vitals Value Taken Time  BP 118/91 10/07/2018  1:32 PM  Temp    Pulse 90 10/07/2018  1:33 PM  Resp 21 10/07/2018  1:33 PM  SpO2 99 % 10/07/2018  1:33 PM  Vitals shown include unvalidated device data.  Last Pain:  Vitals:   10/07/18 1043  TempSrc: Oral         Complications: No apparent anesthesia complications

## 2018-10-07 NOTE — Discharge Instructions (Signed)
[facility] °INGUINAL HERNIA REPAIR: POST OP INSTRUCTIONS ° °Always review your discharge instruction sheet given to you by the facility where your surgery was performed. °IF YOU HAVE DISABILITY OR FAMILY LEAVE FORMS, YOU MUST BRING THEM TO THE OFFICE FOR PROCESSING.   °DO NOT GIVE THEM TO YOUR DOCTOR. ° °1. A  prescription for pain medication may be given to you upon discharge.  Take your pain medication as prescribed, if needed.  If narcotic pain medicine is not needed, then you may take acetaminophen (Tylenol) or ibuprofen (Advil) as needed. °2. Take your usually prescribed medications unless otherwise directed. °If you need a refill on your pain medication, please contact your pharmacy.  They will contact our office to request authorization. Prescriptions will not be filled after 5 pm or on week-ends. °3. You should follow a light diet the first 24 hours after arrival home, such as soup and crackers, etc.  Be sure to include lots of fluids daily.  Resume your normal diet the day after surgery. °4.Most patients will experience some swelling and bruising around the umbilicus or in the groin and scrotum.  Ice packs and reclining will help.  Swelling and bruising can take several days to resolve.  °6. It is common to experience some constipation if taking pain medication after surgery.  Increasing fluid intake and taking a stool softener (such as Colace) will usually help or prevent this problem from occurring.  A mild laxative (Milk of Magnesia or Miralax) should be taken according to package directions if there are no bowel movements after 48 hours. °7. Unless discharge instructions indicate otherwise, you may remove your bandages 24-48 hours after surgery, and you may shower at that time.  You may have steri-strips (small skin tapes) in place directly over the incision.  These strips should be left on the skin for 7-10 days.  If your surgeon used skin glue on the incision, you may  shower in 24 hours.  The glue will flake off over the next 2-3 weeks.  Any sutures or staples will be removed at the office during your follow-up visit. °8. ACTIVITIES:  You may resume regular (light) daily activities beginning the next day--such as daily self-care, walking, climbing stairs--gradually increasing activities as tolerated.  You may have sexual intercourse when it is comfortable.  Refrain from any heavy lifting or straining until approved by your doctor. ° °a.You may drive when you are no longer taking prescription pain medication, you can comfortably wear a seatbelt, and you can safely maneuver your car and apply brakes. °b.RETURN TO WORK:   °_____________________________________________ ° °9.You should see your doctor in the office for a follow-up appointment approximately 2-3 weeks after your surgery.  Make sure that you call for this appointment within a day or two after you arrive home to insure a convenient appointment time. °10.OTHER INSTRUCTIONS: _________________________ °   _____________________________________ ° °WHEN TO CALL YOUR DOCTOR: °1. Fever over 101.0 °2. Inability to urinate °3. Nausea and/or vomiting °4. Extreme swelling or bruising °5. Continued bleeding from incision. °6. Increased pain, redness, or drainage from the incision ° °The clinic staff is available to answer your questions during regular business hours.  Please don’t hesitate to call and ask to speak to one of the nurses for clinical concerns.  If you have a medical emergency, go to the nearest emergency room or call 911.  A surgeon from [facility] is always on call at the hospital ° ° °[address], [address], [place], [place]  [address] ? ° [address] [address], [place], [place]   [address] °[phone] ? [phone] ? FAX [phone] °Web site: [email or web address] ° °

## 2018-10-08 ENCOUNTER — Encounter (HOSPITAL_COMMUNITY): Payer: Self-pay | Admitting: General Surgery

## 2018-10-10 NOTE — Anesthesia Postprocedure Evaluation (Signed)
Anesthesia Post Note  Patient: Blake Thornton  Procedure(s) Performed: LAPAROSCOPIC RIGHT  INGUINAL HERNIA WITH MESH (Right Inguinal) INSERTION OF MESH (N/A Inguinal)     Patient location during evaluation: PACU Anesthesia Type: General Level of consciousness: awake and alert Pain management: pain level controlled Vital Signs Assessment: post-procedure vital signs reviewed and stable Respiratory status: spontaneous breathing, nonlabored ventilation, respiratory function stable and patient connected to nasal cannula oxygen Cardiovascular status: blood pressure returned to baseline and stable Postop Assessment: no apparent nausea or vomiting Anesthetic complications: no    Last Vitals:  Vitals:   10/07/18 1402 10/07/18 1415  BP: 121/85   Pulse: 65 72  Resp: 17 17  Temp:  (!) 36.3 C  SpO2: 98% 98%    Last Pain:  Vitals:   10/07/18 1332  TempSrc:   PainSc: Asleep                 Aarthi Uyeno S

## 2019-01-20 ENCOUNTER — Encounter: Payer: Self-pay | Admitting: Endocrinology

## 2019-01-21 ENCOUNTER — Ambulatory Visit (INDEPENDENT_AMBULATORY_CARE_PROVIDER_SITE_OTHER): Payer: Self-pay | Admitting: Endocrinology

## 2019-01-21 ENCOUNTER — Other Ambulatory Visit: Payer: Self-pay

## 2019-01-21 DIAGNOSIS — E229 Hyperfunction of pituitary gland, unspecified: Secondary | ICD-10-CM

## 2019-01-21 DIAGNOSIS — D352 Benign neoplasm of pituitary gland: Secondary | ICD-10-CM

## 2019-01-21 MED ORDER — BROMOCRIPTINE MESYLATE 2.5 MG PO TABS: 2.5000 mg | ORAL_TABLET | Freq: Every day | ORAL | 3 refills | Status: AC

## 2019-01-21 NOTE — Progress Notes (Signed)
Subjective:    Patient ID: Blake Thornton, male    DOB: 1981/09/15, 38 y.o.   MRN: [id number]  HPI  telehealth visit today via doxy video visit.  Alternatives to telehealth are presented to this patient, and the patient agrees to the telehealth visit.   Pt is advised of the cost of the visit, and agrees to this, also.   Patient is at home, and I am at the office.   Persons attending the telehealth visit: the patient, girlfriend (who translates), and I.   pt returns for f/u of hyperprolactinemia and hypogonadism; wife translates; he had puberty at age 85; he has no biological children; he and wife have been trying to conceive a child x 6 months, without success; wife has 3 children from a previous marriage; pt's former girlfriend suffered a miscarriage; he had a serious head injury in 2010; he says he was [facility] then; MRI showed 4 mm adenoma; plan is to normalize prolactin, then see if testosterone normalizes with it).  He has not recently taken the parlodel.  He hs slight headache, and assoc lightheadedness.   Past Medical History:  Diagnosis Date  . GERD (gastroesophageal reflux disease)   . History of traumatic injury to musculoskeletal system    fall 18 feet  . Hyperlipidemia   . Hyperprolactinemia ([place]) 07/10/2017  . Low serum testosterone 07/10/2017    Past Surgical History:  Procedure Laterality Date  . INGUINAL HERNIA REPAIR Right 10/07/2018   Procedure: LAPAROSCOPIC RIGHT  INGUINAL HERNIA WITH MESH;  Surgeon: Ralene Ok, MD;  Location: [place];  Service: General;  Laterality: Right;  . INSERTION OF MESH N/A 10/07/2018   Procedure: INSERTION OF MESH;  Surgeon: Ralene Ok, MD;  Location: [place];  Service: General;  Laterality: N/A;  . WRIST SURGERY      Social History   Socioeconomic History  . Marital status: Married    Spouse name: Not on file  . Number of children: Not on file  . Years of education: Not on file  . Highest education level:  Not on file  Occupational History  . Not on file  Social Needs  . Financial resource strain: Not on file  . Food insecurity:    Worry: Not on file    Inability: Not on file  . Transportation needs:    Medical: Not on file    Non-medical: Not on file  Tobacco Use  . Smoking status: Never Smoker  . Smokeless tobacco: Never Used  Substance and Sexual Activity  . Alcohol use: No  . Drug use: No  . Sexual activity: Yes  Lifestyle  . Physical activity:    Days per week: Not on file    Minutes per session: Not on file  . Stress: Not on file  Relationships  . Social connections:    Talks on phone: Not on file    Gets together: Not on file    Attends religious service: Not on file    Active member of club or organization: Not on file    Attends meetings of clubs or organizations: Not on file    Relationship status: Not on file  . Intimate partner violence:    Fear of current or ex partner: Not on file    Emotionally abused: Not on file    Physically abused: Not on file    Forced sexual activity: Not on file  Other Topics Concern  . Not on file  Social History  Narrative  . Not on file    Current Outpatient Medications on File Prior to Visit  Medication Sig Dispense Refill  . diclofenac sodium (VOLTAREN) 1 % GEL Apply 2 g topically 4 (four) times daily. To affected joint. (Patient taking differently: Apply 2 g topically 4 (four) times daily as needed (for joint pain). ) 100 g 11  . Multiple Vitamin (MULTIVITAMIN WITH MINERALS) TABS tablet Take 1 tablet by mouth every evening.    . traMADol (ULTRAM) 50 MG tablet Take 1 tablet (50 mg total) by mouth every 6 (six) hours as needed. 20 tablet 0   No current facility-administered medications on file prior to visit.     No Known Allergies  Family History  Problem Relation Age of Onset  . Hypertension Mother   . Diabetes Mother   . Alcohol abuse Father   . Hypertension Sister   . Diabetes Sister   . Hypertension Sister   .  Diabetes Sister   . Other Neg Hx        hyperprolactinemia      Review of Systems Denies n/v.  He has ED and muscle weakness.      Objective:   Physical Exam      Assessment & Plan:  Hyperprolactinemia: therapy limited by noncompliance.   Low testosterone: prob due to elev prolactin.  Headache and dizziness: very unlikely pituitary-related.  We discussed repeating MRI.  He declines, due to cost.    Patient Instructions  Please resume taking "bromocriptine," to help your blood sugar. It has possible side effects of nausea and dizziness.  These go away with time.  You can avoid these by taking it at bedtime, and by taking just take 1/2 pill for the first week.   Please redo the blood tests in 1 month.  I have ordered.   You should approach the headache and dizziness as a separate problem.   Please come back for a follow-up appointment in 1 year.

## 2019-01-21 NOTE — Patient Instructions (Addendum)
Please resume taking "bromocriptine," to help your blood sugar. It has possible side effects of nausea and dizziness.  These go away with time.  You can avoid these by taking it at bedtime, and by taking just take 1/2 pill for the first week.   Please redo the blood tests in 1 month.  I have ordered.   You should approach the headache and dizziness as a separate problem.   Please come back for a follow-up appointment in 1 year.

## 2019-02-26 ENCOUNTER — Telehealth: Payer: Self-pay | Admitting: *Deleted

## 2019-02-26 ENCOUNTER — Telehealth (INDEPENDENT_AMBULATORY_CARE_PROVIDER_SITE_OTHER): Payer: Self-pay | Admitting: Family Medicine

## 2019-02-26 DIAGNOSIS — Z20822 Contact with and (suspected) exposure to covid-19: Secondary | ICD-10-CM

## 2019-02-26 DIAGNOSIS — R6889 Other general symptoms and signs: Secondary | ICD-10-CM

## 2019-02-26 DIAGNOSIS — R05 Cough: Secondary | ICD-10-CM

## 2019-02-26 DIAGNOSIS — R059 Cough, unspecified: Secondary | ICD-10-CM

## 2019-02-26 MED ORDER — HYDROCODONE-ACETAMINOPHEN 5-325 MG PO TABS: 0.5000 | ORAL_TABLET | Freq: Every evening | ORAL | 0 refills | Status: DC | PRN

## 2019-02-26 MED ORDER — ALBUTEROL SULFATE HFA 108 (90 BASE) MCG/ACT IN AERS: 2.0000 | INHALATION_SPRAY | Freq: Four times a day (QID) | RESPIRATORY_TRACT | 0 refills | Status: DC | PRN

## 2019-02-26 NOTE — Telephone Encounter (Signed)
I used Geophysical data processor for Spanish to call him for COVID-19 testing scheduling [phone].  Dr. Zella Ball from [facility] referred him to [place] due to coughing and shortness of breath.  A message was left by the interpreter to call us back to be scheduled at [phone].  I thanked the interpreter for her help.

## 2019-02-26 NOTE — Progress Notes (Signed)
Virtual Visit  via Video Note  I connected with      Blake Thornton by a video enabled telemedicine application and verified that I am speaking with the correct person using two identifiers.   I discussed the limitations of evaluation and management by telemedicine and the availability of in person appointments. The patient expressed understanding and agreed to proceed.  History of Present Illness: Blake Thornton is a 38 y.o. male who would like to discuss cough present for 1.5 weeks.  He notes the cough is worsening.  Last night he developed a bit of shortness of breath and hoarseness.  He worsened a bit last night with some shortness of breath as noted.  His wife gave him a leftover albuterol inhaler which helped quite a bit.  He had a bit of chest pain as well last night which has since resolved.  He denies palpitations or current chest pain.  No vomiting or diarrhea.  He denies any sick contacts.  He notes the cough is very obnoxious and interferes with sleep.   Observations/Objective: Exam: Appearance nontoxic no acute distress Normal Speech.  No tachypnea or audible wheezing.   Assessment and Plan: 38 y.o. male with cough shortness of breath and wheezing.  Etiology at this time is unclear however patient certainly will need UXLKG-40 testing prior to in person visit.  Will refer for outpatient PCR based COVID-19 test now.  Will treat empirically with albuterol as he had benefit with that as well as hydrocodone for cough suppression.  Continue over-the-counter medication.  Precautions reviewed.  Recheck if not improving.  If worsening neck step would be to transition to ED.  PDMP not reviewed this encounter. No orders of the defined types were placed in this encounter.  Meds ordered this encounter  Medications  . albuterol (VENTOLIN HFA) 108 (90 Base) MCG/ACT inhaler    Sig: Inhale 2 puffs into the lungs every 6 (six) hours as needed for wheezing or shortness of breath.   Dispense:  1 Inhaler    Refill:  0  . HYDROcodone-acetaminophen (NORCO/VICODIN) 5-325 MG tablet    Sig: Take 0.5 tablets by mouth at bedtime as needed (cough).    Dispense:  10 tablet    Refill:  0    Follow Up Instructions:    I discussed the assessment and treatment plan with the patient. The patient was provided an opportunity to ask questions and all were answered. The patient agreed with the plan and demonstrated an understanding of the instructions.   The patient was advised to call back or seek an in-person evaluation if the symptoms worsen or if the condition fails to improve as anticipated.  Time: 15 minutes of intraservice time, with >22 minutes of total time during today's visit.       Historical information moved to improve visibility of documentation.  Past Medical History:  Diagnosis Date  . GERD (gastroesophageal reflux disease)   . History of traumatic injury to musculoskeletal system    fall 18 feet  . Hyperlipidemia   . Hyperprolactinemia ([place]) 07/10/2017  . Low serum testosterone 07/10/2017   Past Surgical History:  Procedure Laterality Date  . INGUINAL HERNIA REPAIR Right 10/07/2018   Procedure: LAPAROSCOPIC RIGHT  INGUINAL HERNIA WITH MESH;  Surgeon: Ralene Ok, MD;  Location: [place];  Service: General;  Laterality: Right;  . INSERTION OF MESH N/A 10/07/2018   Procedure: INSERTION OF MESH;  Surgeon: Ralene Ok, MD;  Location: [place];  Service: General;  Laterality: N/A;  . WRIST SURGERY     Social History   Tobacco Use  . Smoking status: Never Smoker  . Smokeless tobacco: Never Used  Substance Use Topics  . Alcohol use: No   family history includes Alcohol abuse in his father; Diabetes in his mother, sister, and sister; Hypertension in his mother, sister, and sister.  Medications: Current Outpatient Medications  Medication Sig Dispense Refill  . albuterol (VENTOLIN HFA) 108 (90 Base) MCG/ACT inhaler Inhale 2 puffs into the lungs every 6  (six) hours as needed for wheezing or shortness of breath. 1 Inhaler 0  . bromocriptine (PARLODEL) 2.5 MG tablet Take 1 tablet (2.5 mg total) by mouth at bedtime. 90 tablet 3  . diclofenac sodium (VOLTAREN) 1 % GEL Apply 2 g topically 4 (four) times daily. To affected joint. (Patient taking differently: Apply 2 g topically 4 (four) times daily as needed (for joint pain). ) 100 g 11  . HYDROcodone-acetaminophen (NORCO/VICODIN) 5-325 MG tablet Take 0.5 tablets by mouth at bedtime as needed (cough). 10 tablet 0  . Multiple Vitamin (MULTIVITAMIN WITH MINERALS) TABS tablet Take 1 tablet by mouth every evening.    . traMADol (ULTRAM) 50 MG tablet Take 1 tablet (50 mg total) by mouth every 6 (six) hours as needed. 20 tablet 0   No current facility-administered medications for this visit.    No Known Allergies

## 2019-02-26 NOTE — Telephone Encounter (Signed)
I used Geophysical data processor for [employer] [phone] Quillian Quince.  I called pt a 2nd time to schedule him for the COVID-19 test requested by Dr. Gregor Hams from [place].     I made him an appt at the [facility] for 3:45 on 02/27/2019.    Order entered.

## 2019-02-27 ENCOUNTER — Other Ambulatory Visit: Payer: Self-pay

## 2019-02-27 DIAGNOSIS — Z20822 Contact with and (suspected) exposure to covid-19: Secondary | ICD-10-CM

## 2019-05-04 ENCOUNTER — Telehealth (INDEPENDENT_AMBULATORY_CARE_PROVIDER_SITE_OTHER): Payer: Self-pay | Admitting: Physician Assistant

## 2019-05-04 ENCOUNTER — Encounter: Payer: Self-pay | Admitting: Physician Assistant

## 2019-05-04 DIAGNOSIS — R079 Chest pain, unspecified: Secondary | ICD-10-CM

## 2019-05-04 NOTE — Progress Notes (Signed)
Virtual Visit via Telephone Note  I connected with Blake Thornton on 05/04/19 at  8:30 AM EDT by telephone and verified that I am speaking with the correct person using two identifiers.   I discussed the limitations of evaluation and management by telemedicine and the availability of in person appointments. The patient expressed understanding and agreed to proceed.  History of Present Illness: HPI:                                                                Blake Thornton is a 38 y.o. male   CC: chest pain  Chest Pain  This is a new problem. The current episode started 1 to 4 weeks ago (x 2 weeks). The onset quality is undetermined. The problem occurs every several days. The problem has been unchanged. The pain is present in the lateral region. The pain is at a severity of 6/10 (lasts a few seconds). The pain does not radiate. Pertinent negatives include no abdominal pain, back pain, cough, diaphoresis, dizziness, exertional chest pressure, fever, headaches, irregular heartbeat, nausea, near-syncope, palpitations, shortness of breath, syncope, vomiting or weakness. Risk factors include male gender.       Past Medical History:  Diagnosis Date  . GERD (gastroesophageal reflux disease)   . History of traumatic injury to musculoskeletal system    fall 18 feet  . Hyperlipidemia   . Hyperprolactinemia ([place]) 07/10/2017  . Low serum testosterone 07/10/2017   Past Surgical History:  Procedure Laterality Date  . INGUINAL HERNIA REPAIR Right 10/07/2018   Procedure: LAPAROSCOPIC RIGHT  INGUINAL HERNIA WITH MESH;  Surgeon: Ralene Ok, MD;  Location: [place];  Service: General;  Laterality: Right;  . INSERTION OF MESH N/A 10/07/2018   Procedure: INSERTION OF MESH;  Surgeon: Ralene Ok, MD;  Location: [place];  Service: General;  Laterality: N/A;  . WRIST SURGERY     Social History   Tobacco Use  . Smoking status: Never Smoker  . Smokeless tobacco: Never Used  Substance Use Topics   . Alcohol use: No   family history includes Alcohol abuse in his father; Diabetes in his mother, sister, and sister; Hypertension in his mother, sister, and sister.    ROS: negative except as noted in the HPI  Medications: Current Outpatient Medications  Medication Sig Dispense Refill  . albuterol (VENTOLIN HFA) 108 (90 Base) MCG/ACT inhaler Inhale 2 puffs into the lungs every 6 (six) hours as needed for wheezing or shortness of breath. 1 Inhaler 0  . diclofenac sodium (VOLTAREN) 1 % GEL Apply 2 g topically 4 (four) times daily. To affected joint. (Patient taking differently: Apply 2 g topically 4 (four) times daily as needed (for joint pain). ) 100 g 11  . Multiple Vitamin (MULTIVITAMIN WITH MINERALS) TABS tablet Take 1 tablet by mouth every evening.    . bromocriptine (PARLODEL) 2.5 MG tablet Take 1 tablet (2.5 mg total) by mouth at bedtime. 90 tablet 3   No current facility-administered medications for this visit.    No Known Allergies     Objective:  There were no vitals taken for this visit. Wt Readings from Last 3 Encounters:  10/06/18 167 lb 14.4 oz (76.2 kg)  01/17/18 157 lb (71.2 kg)  01/15/18 160 lb (72.6 kg)  Temp Readings from Last 3 Encounters:  10/07/18 (!) 97.3 F (36.3 C)  10/06/18 97.8 F (36.6 C)  10/17/17 97.9 F (36.6 C) (Oral)   BP Readings from Last 3 Encounters:  10/07/18 121/85  10/06/18 116/76  01/17/18 109/71   Pulse Readings from Last 3 Encounters:  10/07/18 72  10/06/18 (!) 57  01/17/18 (!) 59   Pulm: Normal work of breathing, normal phonation  No results found for this or any previous visit (from the past 72 hour(s)). No results found.    Assessment and Plan: 38 y.o. male with   .Blake Thornton was seen today for chest pain.  Diagnoses and all orders for this visit:  Right-sided chest pain   2 weeks of non-exertional R sided CP lasting several seconds with no associated symptoms DDx is broad and includes GERD, precordial catch,  costochondritis and less likely PE, ACS Patient unable to provide vital signs Assessment limited to history due to virtual visit today Recommended in person follow-up appointment. Scheduled for Thursday. Patient was counseled on ER precautions   Follow Up Instructions:    I discussed the assessment and treatment plan with the patient. The patient was provided an opportunity to ask questions and all were answered. The patient agreed with the plan and demonstrated an understanding of the instructions.   The patient was advised to call back or seek an in-person evaluation if the symptoms worsen or if the condition fails to improve as anticipated.  I provided 5-10 minutes of non-face-to-face time during this encounter.   Trixie Dredge, [place]

## 2019-05-07 ENCOUNTER — Other Ambulatory Visit: Payer: Self-pay

## 2019-05-07 ENCOUNTER — Encounter: Payer: Self-pay | Admitting: Physician Assistant

## 2019-05-07 ENCOUNTER — Ambulatory Visit (INDEPENDENT_AMBULATORY_CARE_PROVIDER_SITE_OTHER): Payer: Self-pay

## 2019-05-07 ENCOUNTER — Ambulatory Visit (INDEPENDENT_AMBULATORY_CARE_PROVIDER_SITE_OTHER): Payer: Self-pay | Admitting: Physician Assistant

## 2019-05-07 VITALS — BP 113/75 | HR 51 | Temp 98.5°F | Wt 156.0 lb

## 2019-05-07 DIAGNOSIS — R42 Dizziness and giddiness: Secondary | ICD-10-CM

## 2019-05-07 DIAGNOSIS — R001 Bradycardia, unspecified: Secondary | ICD-10-CM | POA: Insufficient documentation

## 2019-05-07 DIAGNOSIS — R0789 Other chest pain: Secondary | ICD-10-CM

## 2019-05-07 LAB — CBC WITH DIFFERENTIAL/PLATELET
Absolute Monocytes: 462 cells/uL (ref 200–950)
Basophils Absolute: 22 cells/uL (ref 0–200)
Basophils Relative: 0.4 %
Eosinophils Absolute: 253 cells/uL (ref 15–500)
Eosinophils Relative: 4.6 %
HCT: 41.5 % (ref 38.5–50.0)
Hemoglobin: 13.4 g/dL (ref 13.2–17.1)
Lymphs Abs: 1617 cells/uL (ref 850–3900)
MCH: 26.7 pg — ABNORMAL LOW (ref 27.0–33.0)
MCHC: 32.3 g/dL (ref 32.0–36.0)
MCV: 82.8 fL (ref 80.0–100.0)
MPV: 10.7 fL (ref 7.5–12.5)
Monocytes Relative: 8.4 %
Neutro Abs: 3146 cells/uL (ref 1500–7800)
Neutrophils Relative %: 57.2 %
Platelets: 285 10*3/uL (ref 140–400)
RBC: 5.01 10*6/uL (ref 4.20–5.80)
RDW: 13.5 % (ref 11.0–15.0)
Total Lymphocyte: 29.4 %
WBC: 5.5 10*3/uL (ref 3.8–10.8)

## 2019-05-07 LAB — COMPLETE METABOLIC PANEL WITH GFR
AG Ratio: 1.5 (calc) (ref 1.0–2.5)
ALT: 20 U/L (ref 9–46)
AST: 21 U/L (ref 10–40)
Albumin: 4.5 g/dL (ref 3.6–5.1)
Alkaline phosphatase (APISO): 80 U/L (ref 36–130)
BUN: 19 mg/dL (ref 7–25)
CO2: 27 mmol/L (ref 20–32)
Calcium: 9.5 mg/dL (ref 8.6–10.3)
Chloride: 105 mmol/L (ref 98–110)
Creat: 0.78 mg/dL (ref 0.60–1.35)
GFR, Est African American: 135 mL/min/{1.73_m2} (ref >=60)
GFR, Est Non African American: 116 mL/min/{1.73_m2} (ref >=60)
Globulin: 3.1 g/dL (calc) (ref 1.9–3.7)
Glucose, Bld: 97 mg/dL (ref 65–99)
Potassium: 4.1 mmol/L (ref 3.5–5.3)
Sodium: 139 mmol/L (ref 135–146)
Total Bilirubin: 0.9 mg/dL (ref 0.2–1.2)
Total Protein: 7.6 g/dL (ref 6.1–8.1)

## 2019-05-07 LAB — D-DIMER, QUANTITATIVE: D-Dimer, Quant: 0.19 mcg/mL FEU (ref <0.50)

## 2019-05-07 LAB — TSH+FREE T4: TSH W/REFLEX TO FT4: 1.05 mIU/L (ref 0.40–4.50)

## 2019-05-07 LAB — TROPONIN I: Troponin I: <0.01 ng/mL (ref <=0.0)

## 2019-05-07 MED ORDER — NAPROXEN 500 MG PO TABS: ORAL_TABLET | ORAL | 3 refills | Status: AC

## 2019-05-07 NOTE — Patient Instructions (Signed)
Dolor en la pared torcica Chest Wall Pain El dolor en la pared torcica se produce en los huesos y los msculos del pecho o alrededor de Orthoptist. Las causas del dolor en la pared torcica pueden ser las siguientes:  Una lesin.  Mucha tos.  Usar [employer] del pecho y de [place]. En ocasiones, la causa puede ser desconocida. Este dolor puede tardar varias semanas o ms en mejorar. Siga estas indicaciones en su casa: Control del dolor, la rigidez y la hinchazn Si se lo indican, aplique hielo sobre la zona dolorida:  Ponga el hielo en una bolsa plstica.  Coloque una toalla entre la piel y Therapist, nutritional.  Coloque el hielo durante 31minutos, 2 a 3veces por Training and development officer.  Actividad  Haga reposo como se lo haya indicado el mdico.  Evite hacer cosas que le causen dolor. Esto incluye levantar objetos pesados.  Pregntele al mdico qu actividades son seguras para usted. Indicaciones generales   [employer] de venta libre y los recetados solamente como se lo haya indicado el mdico.  No consuma ningn producto que contenga nicotina o tabaco, como cigarrillos, cigarrillos electrnicos y tabaco de Higher education careers adviser. Si necesita ayuda para dejar de fumar, consulte al mdico.  Concurra a todas las visitas de seguimiento como se lo haya indicado el mdico. Esto es importante. Comunquese con un mdico si:  Tiene fiebre.  El dolor en el pecho [place].  Aparecen nuevos sntomas. Solicite ayuda inmediatamente si:  Airline pilot (nuseas) o vomita.  Philbert Riser o tiene sensacin de desvanecimiento.  Tiene tos con mucosidad de los pulmones (esputo) o expectora sangre al toser.  Le falta el aire. Estos sntomas pueden Sales executive. No espere a ver si los sntomas desaparecen. Solicite atencin mdica de inmediato. Comunquese con el servicio de emergencias de su localidad (911 en los [place]). No conduzca por sus propios medios [employer]. Resumen   El dolor en la pared torcica se produce en los huesos y los msculos del pecho o alrededor de Orthoptist.  Puede tratarse con hielo, reposo y medicamentos. La afeccin tambin puede mejorar si evita hacer cosas que le causan dolor.  Comunquese con un mdico si tiene fiebre, dolor en el pecho que empeora o sntomas nuevos.  Pida ayuda de inmediato si siente que se va a desvanecer o le falta el aire. Estos sntomas pueden Sales executive. Esta informacin no tiene Marine scientist el consejo del mdico. Asegrese de hacerle al mdico cualquier pregunta que tenga. Document Released: 08/30/2011 Document Revised: 05/09/2018 Document Reviewed: 05/09/2018 Elsevier Patient Education  2020 [employer].

## 2019-05-07 NOTE — Progress Notes (Signed)
HPI:                                                                Blake Thornton is a 38 y.o. male who presents to [facility] today for chest pain  History is provided by patient and wife (by phone)  Patient reports gradually worsening left-sided chest pain x 2 months. Wife states he has complained of chest pain for most of his life, but lately he has been grabbing at his chest and reporting the pain is severe. Pain occurs at rest and is described as sharp and deep lasting for several seconds. He endorses chest wall tenderness. He works full-time as a Theme park manager.  Denies exertional chest pain. No associated palpitations, dyspnea or syncope.  He also reports chronic dizziness that is worse with position changes, specifically laying to standing. Wife states that he shakes his head vigorously when he becomes dizzy stating that he feels like its the only thing that makes it better. He denies ear fullness, tinnitus, hearing change, or gait disturbance. 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  Past Medical History:  Diagnosis Date  . GERD (gastroesophageal reflux disease)   . History of traumatic injury to musculoskeletal system    fall 18 feet  . Hyperlipidemia   . Hyperprolactinemia ([place]) 07/10/2017  . Low serum testosterone 07/10/2017   Past Surgical History:  Procedure Laterality Date  . INGUINAL HERNIA REPAIR Right 10/07/2018   Procedure: LAPAROSCOPIC RIGHT  INGUINAL HERNIA WITH MESH;  Surgeon: Ralene Ok, MD;  Location: [employer] [facility];  Service: General;  Laterality: Right;  . INSERTION OF MESH N/A 10/07/2018   Procedure: INSERTION OF MESH;  Surgeon: Ralene Ok, MD;  Location: [place];  Service: General;  Laterality: N/A;  . WRIST SURGERY     Social History   Tobacco Use  . Smoking status: Never Smoker  . Smokeless tobacco: Never Used   Substance Use Topics  . Alcohol use: No   family history includes Alcohol abuse in his father; Diabetes in his mother, sister, and sister; Hypertension in his mother, sister, and sister.    ROS: negative except as noted in the HPI  Medications: Current Outpatient Medications  Medication Sig Dispense Refill  . diclofenac sodium (VOLTAREN) 1 % GEL Apply 2 g topically 4 (four) times daily. To affected joint. (Patient taking differently: Apply 2 g topically 4 (four) times daily as needed (for joint pain). ) 100 g 11  . Multiple Vitamin (MULTIVITAMIN WITH MINERALS) TABS tablet Take 1 tablet by mouth every evening.    Marland Kitchen albuterol (VENTOLIN HFA) 108 (90 Base) MCG/ACT inhaler Inhale 2 puffs into the lungs every 6 (six) hours as needed for wheezing or shortness of breath. 1 Inhaler 0  . bromocriptine (PARLODEL) 2.5 MG tablet Take 1 tablet (2.5 mg total) by mouth at bedtime. 90 tablet 3  . naproxen (NAPROSYN) 500 MG tablet Take 1 tab by mouth twice a day for 2 weeks, then twice a day as needed for chest wall pain 60 tablet 3   No current facility-administered medications for this visit.    No Known Allergies  Objective:  BP 113/75   Pulse (!) 51   Temp 98.5 F (36.9 C) (Oral)   Wt 156 lb (70.8 kg)   SpO2 98%   BMI 25.18 kg/m  Gen:  alert, not ill-appearing, no distress, appropriate for age HEENT: head normocephalic without obvious abnormality, conjunctiva and cornea clear, trachea midline Pulm: Normal work of breathing, normal phonation, clear to auscultation bilaterally, no wheezes, rales or rhonchi CV: bradycardic rate, regular rhythm, s1 and s2 distinct, no murmurs, clicks or rubs  Neuro: alert and oriented x 3, no tremor; [employer] reproduces symptoms on the left side without nystagmus MSK: extremities atraumatic, normal gait and station, no peripheral edema, distal pulses intact; left-sided chest wall tenderness Skin: intact, no rashes on exposed skin, no jaundice, no  cyanosis Psych: well-groomed, cooperative, good eye contact, euthymic mood, affect mood-congruent, speech is articulate, and thought processes clear and goal-directed  ECG 05/07/19 9:16 Vent rate 54 bpm PR-I 148 ms QRS 86 ms QT/QTc 412/390 ms Sinus bradycardia  No results found for this or any previous visit (from the past 72 hour(s)). No results found.    Assessment and Plan: 38 y.o. male with   .Namon was seen today for chest pain.  Diagnoses and all orders for this visit:  Atypical chest pain -     EKG 12-Lead -     CBC with Differential/Platelet -     COMPLETE METABOLIC PANEL WITH GFR -     TSH + free T4 -     D-dimer, quantitative (not at [facility]) -     Troponin I -     DG Chest 2 View  Sinus bradycardia by electrocardiogram -     CBC with Differential/Platelet -     COMPLETE METABOLIC PANEL WITH GFR -     TSH + free T4  Left-sided chest wall pain -     naproxen (NAPROSYN) 500 MG tablet; Take 1 tab by mouth twice a day for 2 weeks, then twice a day as needed for chest wall pain -     DG Chest 2 View  Vestibular dizziness involving left inner ear -     Ambulatory referral to Physical Therapy   Left-sided chest pain x 2 months Reproducible on exam ECG without ST or T wave abnormalities Low clinical suspicion for ACS or acute PE. Stat D-dimer and troponin pending CXR pending Counseled on general measures for costochondritis/chest wall pain including Naproxen bid  Vestibular dizziness Reassuring neuro exam Dix hallpike on the left reproduces symptoms on left side Referral placed to PT for vestibular rehab  Patient education and anticipatory guidance given Patient agrees with treatment plan Follow-up as needed if symptoms worsen or fail to improve  I spent 40 minutes with this patient, greater than 50% was face-to-face time counseling regarding the above diagnoses   Darlyne Russian PA-C

## 2019-05-14 ENCOUNTER — Ambulatory Visit: Payer: Self-pay | Admitting: Rehabilitative and Restorative Service Providers"

## 2019-05-15 ENCOUNTER — Ambulatory Visit (INDEPENDENT_AMBULATORY_CARE_PROVIDER_SITE_OTHER): Payer: Self-pay | Admitting: Physical Therapy

## 2019-05-15 ENCOUNTER — Other Ambulatory Visit: Payer: Self-pay

## 2019-05-15 ENCOUNTER — Encounter: Payer: Self-pay | Admitting: Physical Therapy

## 2019-05-15 DIAGNOSIS — R42 Dizziness and giddiness: Secondary | ICD-10-CM

## 2019-05-15 DIAGNOSIS — H8112 Benign paroxysmal vertigo, left ear: Secondary | ICD-10-CM

## 2019-05-15 NOTE — Therapy (Signed)
[facility] [place], [place], [address] Phone: [phone]   Fax:  [phone]  Physical Therapy Evaluation  Patient Details  Name: Blake Thornton MRN: [id number] Date of Birth: [birth year] Referring Provider (PT): Trixie Dredge, [place]   Encounter Date: 05/15/2019  PT End of Session - 05/15/19 0834    Visit Number  1    Number of Visits  6    Date for PT Re-Evaluation  06/26/19    PT Start Time  0808    PT Stop Time  0832    PT Time Calculation (min)  24 min    Activity Tolerance  Patient tolerated treatment well    Behavior During Therapy  [facility] for tasks assessed/performed       Past Medical History:  Diagnosis Date  . GERD (gastroesophageal reflux disease)   . History of traumatic injury to musculoskeletal system    fall 18 feet  . Hyperlipidemia   . Hyperprolactinemia ([place]) 07/10/2017  . Low serum testosterone 07/10/2017    Past Surgical History:  Procedure Laterality Date  . INGUINAL HERNIA REPAIR Right 10/07/2018   Procedure: LAPAROSCOPIC RIGHT  INGUINAL HERNIA WITH MESH;  Surgeon: Ralene Ok, MD;  Location: [place];  Service: General;  Laterality: Right;  . INSERTION OF MESH N/A 10/07/2018   Procedure: INSERTION OF MESH;  Surgeon: Ralene Ok, MD;  Location: [place];  Service: General;  Laterality: N/A;  . WRIST SURGERY      There were no vitals filed for this visit.   Subjective Assessment - 05/15/19 0810    Subjective  Pt is a 38 y/o male who presents to OPPT for dizziness x several years.  Pt states symptoms worse in mornings and with forward bending.    Patient Stated Goals  improve dizziness    Currently in Pain?  No/denies         [facility] PT Assessment - 05/15/19 [id number]      Assessment   Medical Diagnosis  R42 (ICD-10-CM) - Vestibular dizziness involving left inner ear    Referring Provider (PT)  Trixie Dredge, PA-C    Onset Date/Surgical Date  --    chronic   Next MD Visit  06/04/19    Prior Therapy  unrelated to condition      Precautions   Precautions  None      Restrictions   Weight Bearing Restrictions  No      Balance Screen   Has the patient fallen in the past 6 months  No    Has the patient had a decrease in activity level because of a fear of falling?   No    Is the patient reluctant to leave their home because of a fear of falling?   No      Home Environment   Living Environment  Private residence    Living Arrangements  Spouse/significant other;Children   28, 24, 28 y/o   Additional Comments  no difficulty with stairs      Prior Function   Level of Independence  Independent    Vocation  Full time employment    Vocation Requirements  roofing; lifting, manual labor    Leisure  play soccer      Cognition   Overall Cognitive Status  Within Functional Limits for tasks assessed           Vestibular Assessment - 05/15/19 0814      Vestibular Assessment   General Observation  no  symptoms at rest      Symptom Behavior   Subjective history of current problem  see subjective    Type of Dizziness   --   flashes of light   Frequency of Dizziness  in AM everytime he sits up    Duration of Dizziness  seconds    Symptom Nature  Motion provoked;Positional    Aggravating Factors  Supine to sit;Forward bending    Relieving Factors  Head stationary    Progression of Symptoms  No change since onset      Oculomotor Exam   Oculomotor Alignment  Normal    Spontaneous  Absent    Gaze-induced   Absent    Smooth Pursuits  Intact    Saccades  Slow;Intact   likely due to language barrier     Oculomotor Exam-Fixation Suppressed    Left Head Impulse  mild catch up saccade noted    Right Head Impulse  neg      Vestibulo-Ocular Reflex   VOR 1 Head Only (x 1 viewing)  WNL - mod cues      Positional Testing   Dix-Hallpike  Dix-Hallpike Right;Dix-Hallpike Left      Dix-Hallpike Right   Dix-Hallpike Right Duration  c/o  seeing "flashes," no dizziness or symptoms    Dix-Hallpike Right Symptoms  No nystagmus      Dix-Hallpike Left   Dix-Hallpike Left Duration  5 sec    Dix-Hallpike Left Symptoms  No nystagmus          Objective measurements completed on examination: See above findings.       Vestibular Treatment/Exercise - 05/15/19 [id number]      Vestibular Treatment/Exercise   Vestibular Treatment Provided  Canalith Repositioning    Canalith Repositioning  Epley Manuever Left       EPLEY MANUEVER LEFT   Number of Reps   2    Overall Response   Symptoms Resolved            PT Education - 05/15/19 0834    Education Details  BPPV    Person(s) Educated  Patient    Methods  Explanation;Handout    Comprehension  Verbalized understanding          PT Long Term Goals - 05/15/19 0836      PT LONG TERM GOAL #1   Title  to be determined if pt returns             Plan - 05/15/19 0834    Clinical Impression Statement  Pt is a 38 y/o male who presents to OPPT for vertigo.  Symptoms with Lt Dix-Hallpike however no nystagmus appreciated.  Treated with Lt epley's today with resolve of symptoms on 2nd rep.  Will hold PT and pt to return if symptoms continue.    Examination-Activity Limitations  Bed Mobility    Stability/Clinical Decision Making  Stable/Uncomplicated    Clinical Decision Making  Low    Rehab Potential  Good    PT Frequency  --   PRN 1-2x/wk   PT Duration  6 weeks    PT Treatment/Interventions  ADLs/Self Care Home Management;Canalith Repostioning;Balance training;Therapeutic exercise;Therapeutic activities;Functional mobility training;Stair training;Gait training;Neuromuscular re-education;Patient/family education;Vestibular    PT Next Visit Plan  reassess if pt returns    Consulted and Agree with Plan of Care  Patient       Patient will benefit from skilled therapeutic intervention in order to improve the following deficits and impairments:  Dizziness  Visit  Diagnosis: BPPV (  benign paroxysmal positional vertigo), left - Plan: PT plan of care cert/re-cert  Dizziness and giddiness - Plan: PT plan of care cert/re-cert     Problem List Patient Active Problem List   Diagnosis Date Noted  . Atypical chest pain 05/07/2019  . Sinus bradycardia by electrocardiogram 05/07/2019  . Chronic constipation 10/17/2017  . Inguinal hernia, right 10/17/2017  . 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 Low serum testosterone 07/10/2017  . Family history of diabetes mellitus 07/05/2017  . Fatigue 07/05/2017  . Lateral epicondylitis of right elbow 07/05/2017  . Low libido 07/05/2017  . Weakness generalized 07/05/2017  . Primary osteoarthritis of both shoulders 07/04/2017      Laureen Abrahams, PT, DPT 05/15/19 8:37 AM     [facility] [address] [place], [place], [address] Phone: [phone]   Fax:  [phone]  Name: Blake Thornton MRN: [id number] Date of Birth: [birth year]

## 2019-06-04 ENCOUNTER — Ambulatory Visit: Payer: Self-pay | Admitting: Physician Assistant

## 2019-10-02 ENCOUNTER — Telehealth: Payer: Self-pay

## 2019-10-02 ENCOUNTER — Ambulatory Visit (INDEPENDENT_AMBULATORY_CARE_PROVIDER_SITE_OTHER): Payer: Self-pay | Admitting: Physician Assistant

## 2019-10-02 ENCOUNTER — Inpatient Hospital Stay: Admission: RE | Admit: 2019-10-02 | Payer: Self-pay | Source: Ambulatory Visit

## 2019-10-02 ENCOUNTER — Encounter: Payer: Self-pay | Admitting: Physician Assistant

## 2019-10-02 VITALS — Ht 66.0 in | Wt 156.0 lb

## 2019-10-02 DIAGNOSIS — R5383 Other fatigue: Secondary | ICD-10-CM

## 2019-10-02 DIAGNOSIS — E559 Vitamin D deficiency, unspecified: Secondary | ICD-10-CM

## 2019-10-02 DIAGNOSIS — R531 Weakness: Secondary | ICD-10-CM

## 2019-10-02 DIAGNOSIS — R7989 Other specified abnormal findings of blood chemistry: Secondary | ICD-10-CM

## 2019-10-02 NOTE — Progress Notes (Signed)
Legs/hands - weakness 2 weeks  Patient states no pain/dizziness/other symptoms

## 2019-10-05 DIAGNOSIS — E559 Vitamin D deficiency, unspecified: Secondary | ICD-10-CM | POA: Insufficient documentation

## 2019-10-05 NOTE — Progress Notes (Signed)
Patient ID: Blake Thornton, male   DOB: 05/11/1981, 39 y.o.   MRN: [id number] .Marland KitchenVirtual Visit via Telephone Note  I connected with Clevie Do on 10/02/2019 at 10:50 AM EST by telephone and verified that I am speaking with the correct person using two identifiers.  Location: Patient: home Provider: clinic   I discussed the limitations, risks, security and privacy concerns of performing an evaluation and management service by telephone and the availability of in person appointments. I also discussed with the patient that there may be a patient responsible charge related to this service. The patient expressed understanding and agreed to proceed.   History of Present Illness: Patient is a 39 year old male with pituitary microadenoma and hyperprolactinemia, low testosterone who calls into the clinic to discuss weakness of bilateral arms and legs, fatigue severe for the last 2 weeks.  Patient denies any fever, chills, sinus pressure, sore throat, cough, shortness of breath.  He does see Dr. Loanne Drilling his endocrinologist every 6 months. 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  He denies any tingling in arms or legs.  He has not tried anything to make better and symptoms seem to be stable but not worsening.he is sleeping well and waking up rested.   .. Active Ambulatory Problems    Diagnosis Date Noted  . Primary osteoarthritis of both shoulders 07/04/2017  . Family history of diabetes mellitus 07/05/2017  . Fatigue 07/05/2017  . Lateral epicondylitis of right elbow 07/05/2017  . Low libido 07/05/2017  . Weakness generalized 07/05/2017  . Low testosterone in male 07/10/2017  . 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 Chronic constipation 10/17/2017  . Inguinal hernia, right 10/17/2017  . Atypical chest pain 05/07/2019  . Sinus bradycardia by electrocardiogram 05/07/2019  . Vitamin D deficiency  10/05/2019   Resolved Ambulatory Problems    Diagnosis Date Noted  . 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 Chronic RLQ pain 10/17/2017   Past Medical History:  Diagnosis Date  . GERD (gastroesophageal reflux disease)   . History of traumatic injury to musculoskeletal system   . Hyperlipidemia   . Low serum testosterone 07/10/2017   Reviewed med, allergies, problem list.     Observations/Objective: No acute distress. Normal mood.  Normal breathing.   .. Today's Vitals   10/02/19 0945  Weight: 156 lb (70.8 kg)  Height: 5\' 6"  (1.676 m)   Body mass index is 25.18 kg/m.    Assessment and Plan: Marland KitchenMarland KitchenAlter was seen today for extremity weakness.  Diagnoses and all orders for this visit:  Weakness -     Vitamin D (25 hydroxy) -     TSH -     CBC -     COMPLETE METABOLIC PANEL WITH GFR -     B12 and Folate Panel -     CMV abs, IgG+IgM (cytomegalovirus) -     EBV ab to viral capsid ag pnl, IgG+IgM -     B. burgdorfi antibodies -     Testosterone  Vitamin D deficiency -     Vitamin D (25 hydroxy)  Fatigue, unspecified type -     Vitamin D (25 hydroxy) -     TSH -     CBC -     COMPLETE METABOLIC PANEL WITH GFR -     B12 and Folate Panel -     CMV abs, IgG+IgM (cytomegalovirus) -     EBV ab  to viral capsid ag pnl, IgG+IgM -     B. burgdorfi antibodies -     Testosterone  Low testosterone in male -     Testosterone   Unclear etiology.  Labs ordered.  No new medications started.  Follow up to review medications and to go over next step in plan.  Would like to see in person for physical exam and to check vitals.     Follow Up Instructions:    I discussed the assessment and treatment plan with the patient. The patient was provided an opportunity to ask questions and all were answered. The patient agreed with the plan and demonstrated an understanding of the instructions.   The patient was advised to call back or seek an in-person evaluation if the symptoms  worsen or if the condition fails to improve as anticipated.  I provided 25 minutes of non-face-to-face time during this encounter.   Iran Planas, PA-C

## 2019-10-26 ENCOUNTER — Ambulatory Visit: Payer: Self-pay | Admitting: Endocrinology

## 2019-10-26 DIAGNOSIS — Z0289 Encounter for other administrative examinations: Secondary | ICD-10-CM

## 2020-02-08 ENCOUNTER — Other Ambulatory Visit: Payer: Self-pay | Admitting: Endocrinology

## 2022-02-10 ENCOUNTER — Encounter (HOSPITAL_BASED_OUTPATIENT_CLINIC_OR_DEPARTMENT_OTHER): Payer: Self-pay | Admitting: Emergency Medicine

## 2022-02-10 ENCOUNTER — Emergency Department (HOSPITAL_BASED_OUTPATIENT_CLINIC_OR_DEPARTMENT_OTHER): Payer: Self-pay

## 2022-02-10 ENCOUNTER — Other Ambulatory Visit: Payer: Self-pay

## 2022-02-10 ENCOUNTER — Emergency Department (HOSPITAL_BASED_OUTPATIENT_CLINIC_OR_DEPARTMENT_OTHER)
Admission: EM | Admit: 2022-02-10 | Discharge: 2022-02-10 | Disposition: A | Discharge status: Home / Self Care | Payer: Self-pay | Attending: Emergency Medicine | Admitting: Emergency Medicine

## 2022-02-10 DIAGNOSIS — R112 Nausea with vomiting, unspecified: Secondary | ICD-10-CM | POA: Insufficient documentation

## 2022-02-10 DIAGNOSIS — K219 Gastro-esophageal reflux disease without esophagitis: Secondary | ICD-10-CM | POA: Insufficient documentation

## 2022-02-10 DIAGNOSIS — D72829 Elevated white blood cell count, unspecified: Secondary | ICD-10-CM | POA: Insufficient documentation

## 2022-02-10 DIAGNOSIS — R1031 Right lower quadrant pain: Secondary | ICD-10-CM | POA: Insufficient documentation

## 2022-02-10 LAB — COMPREHENSIVE METABOLIC PANEL
ALT: 19 U/L (ref 0–44)
AST: 20 U/L (ref 15–41)
Albumin: 4.2 g/dL (ref 3.5–5.0)
Alkaline Phosphatase: 71 U/L (ref 38–126)
Anion gap: 7 (ref 5–15)
BUN: 19 mg/dL (ref 6–20)
CO2: 24 mmol/L (ref 22–32)
Calcium: 8.8 mg/dL — ABNORMAL LOW (ref 8.9–10.3)
Chloride: 103 mmol/L (ref 98–111)
Creatinine, Ser: 0.78 mg/dL (ref 0.61–1.24)
GFR, Estimated: >60 mL/min (ref >60)
Glucose, Bld: 139 mg/dL — ABNORMAL HIGH (ref 70–99)
Potassium: 3.5 mmol/L (ref 3.5–5.1)
Sodium: 134 mmol/L — ABNORMAL LOW (ref 135–145)
Total Bilirubin: 1.3 mg/dL — ABNORMAL HIGH (ref 0.3–1.2)
Total Protein: 7.8 g/dL (ref 6.5–8.1)

## 2022-02-10 LAB — CBC
HCT: 42.6 % (ref 39.0–52.0)
Hemoglobin: 14.1 g/dL (ref 13.0–17.0)
MCH: 27.8 pg (ref 26.0–34.0)
MCHC: 33.1 g/dL (ref 30.0–36.0)
MCV: 83.9 fL (ref 80.0–100.0)
Platelets: 267 10*3/uL (ref 150–400)
RBC: 5.08 MIL/uL (ref 4.22–5.81)
RDW: 13.2 % (ref 11.5–15.5)
WBC: 18.9 10*3/uL — ABNORMAL HIGH (ref 4.0–10.5)
nRBC: 0 % (ref 0.0–0.2)

## 2022-02-10 LAB — URINALYSIS, ROUTINE W REFLEX MICROSCOPIC
Bilirubin Urine: NEGATIVE
Glucose, UA: NEGATIVE mg/dL
Ketones, ur: NEGATIVE mg/dL
Leukocytes,Ua: NEGATIVE
Nitrite: NEGATIVE
Protein, ur: NEGATIVE mg/dL
Specific Gravity, Urine: 1.025 (ref 1.005–1.030)
pH: 5.5 (ref 5.0–8.0)

## 2022-02-10 LAB — URINALYSIS, MICROSCOPIC (REFLEX)

## 2022-02-10 LAB — LIPASE, BLOOD: Lipase: 22 U/L (ref 11–51)

## 2022-02-10 MED ORDER — DICYCLOMINE HCL 20 MG PO TABS: 20.0000 mg | ORAL_TABLET | Freq: Two times a day (BID) | ORAL | 0 refills | Status: AC | PRN

## 2022-02-10 MED ORDER — ONDANSETRON 4 MG PO TBDP: 4.0000 mg | ORAL_TABLET | Freq: Three times a day (TID) | ORAL | 0 refills | Status: AC | PRN

## 2022-02-10 MED ORDER — IOHEXOL 300 MG/ML  SOLN
100.0000 mL | Freq: Once | INTRAMUSCULAR | Status: AC | PRN
  Administered 2022-02-10: 100 mL via INTRAVENOUS

## 2022-02-10 MED ORDER — MORPHINE SULFATE (PF) 4 MG/ML IV SOLN
4.0000 mg | Freq: Once | INTRAVENOUS | Status: AC
  Administered 2022-02-10: 4 mg via INTRAVENOUS
  Filled 2022-02-10: qty 1

## 2022-02-10 NOTE — ED Provider Notes (Signed)
[facility] Provider Note   CSN: [id number] Arrival date & time: 02/10/22  2001     History  Chief Complaint  Patient presents with   Abdominal Pain    Blake Thornton is a 41 y.o. male.  HPI     2PM developed RLQ abdominal pain radiating into the back. Has had associated nausea and vomiting.  No known fever. No urinary symptoms.  Has not had diarrhea or constipation.  Has hx of inguinal hernia surgery.  Nothing makes pain better or worse. Has had hx of prior trauma and prior abdominal pain to RLQ but has not had it recently.  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 Authorizing Provider  dicyclomine (BENTYL) 20 MG tablet Take 1 tablet (20 mg total) by mouth 2 (two) times daily as needed for spasms. 02/10/22  Yes Gareth Morgan, MD  ondansetron (ZOFRAN-ODT) 4 MG disintegrating tablet Take 1 tablet (4 mg total) by mouth every 8 (eight) hours as needed for nausea or vomiting. 02/10/22  Yes Gareth Morgan, MD  bromocriptine (PARLODEL) 2.5 MG tablet Take 1 tablet (2.5 mg total) by mouth at bedtime. 01/21/19   Renato Shin, MD  diclofenac sodium (VOLTAREN) 1 % GEL Apply 2 g topically 4 (four) times daily. To affected joint. Patient taking differently: Apply 2 g topically 4 (four) times daily as needed (for joint pain).  07/03/17   Trixie Dredge, PA-C  naproxen (NAPROSYN) 500 MG tablet Take 1 tab by mouth twice a day for 2 weeks, then twice a day as needed for chest wall pain 05/07/19   Trixie Dredge, PA-C      Allergies    Patient has no known allergies.    Review of Systems   Review of Systems  Physical Exam Updated  Vital Signs BP (!) 112/94   Pulse 86   Temp 99.1 F (37.3 C) (Oral)   Resp 20   Ht '5\' 5"'$  (1.651 m)   Wt 68 kg   SpO2 99%   BMI 24.96 kg/m  Physical Exam Vitals and nursing note reviewed.  Constitutional:      General: He is not in acute distress.    Appearance: He is well-developed. He is not diaphoretic.  HENT:     Head: Normocephalic and atraumatic.  Eyes:     Conjunctiva/sclera: Conjunctivae normal.  Cardiovascular:     Rate and Rhythm: Normal rate and regular rhythm.     Heart sounds: Normal heart sounds. No murmur heard.   No friction rub. No gallop.  Pulmonary:     Effort: Pulmonary effort is normal. No respiratory distress.     Breath sounds: Normal breath sounds. No wheezing or rales.  Abdominal:     General: There is no distension.     Palpations: Abdomen is soft.     Tenderness: There is abdominal tenderness in the right lower quadrant. There is no guarding. Positive signs include McBurney's sign. Negative signs include Murphy's sign.  Musculoskeletal:     Cervical back: Normal range of motion.  Skin:    General: Skin is warm and dry.  Neurological:     Mental Status: He is alert and oriented to  person, place, and time.    ED Results / Procedures / Treatments   Labs (all labs ordered are listed, but only abnormal results are displayed) Labs Reviewed  COMPREHENSIVE METABOLIC PANEL - Abnormal; Notable for the following components:      Result Value   Sodium 134 (*)    Glucose, Bld 139 (*)    Calcium 8.8 (*)    Total Bilirubin 1.3 (*)    All other components within normal limits  CBC - Abnormal; Notable for the following components:   WBC 18.9 (*)    All other components within normal limits  URINALYSIS, ROUTINE W REFLEX MICROSCOPIC - Abnormal; Notable for the following components:   Hgb urine dipstick TRACE (*)    All other components within normal limits  URINALYSIS, MICROSCOPIC (REFLEX) - Abnormal; Notable for the following components:   Bacteria, UA  FEW (*)    All other components within normal limits  LIPASE, BLOOD    EKG None  Radiology CT ABDOMEN PELVIS W CONTRAST  Result Date: 02/10/2022 CLINICAL DATA:  Right lower quadrant abdominal pain, nausea, vomiting EXAM: CT ABDOMEN AND PELVIS WITH CONTRAST TECHNIQUE: Multidetector CT imaging of the abdomen and pelvis was performed using the standard protocol following bolus administration of intravenous contrast. RADIATION DOSE REDUCTION: This exam was performed according to the departmental dose-optimization program which includes automated exposure control, adjustment of the mA and/or kV according to patient size and/or use of iterative reconstruction technique. CONTRAST:  139m OMNIPAQUE IOHEXOL 300 MG/ML  SOLN COMPARISON:  06/09/2015 FINDINGS: Lower chest: No acute abnormality. Hepatobiliary: No focal liver abnormality is seen. No gallstones, gallbladder wall thickening, or biliary dilatation. Pancreas: Unremarkable Spleen: Unremarkable Adrenals/Urinary Tract: Adrenal glands are unremarkable. Kidneys are normal, without renal calculi, focal lesion, or hydronephrosis. Bladder is unremarkable. Stomach/Bowel: Mild sigmoid diverticulosis. The stomach, small bowel, and large bowel are otherwise unremarkable. Appendix normal. No free intraperitoneal gas or fluid. Vascular/Lymphatic: No significant vascular findings are present. No enlarged abdominal or pelvic lymph nodes. Reproductive: Prostate is unremarkable. Other: Right inguinal hernia repair with mesh has been performed. No recurrent abdominal wall hernia. Musculoskeletal: No acute bone abnormality. No lytic or blastic bone lesion. IMPRESSION: No acute intra-abdominal pathology identified. No definite radiographic explanation for the patient's reported symptoms. Status post right inguinal hernia repair with mesh. No recurrent hernia identified. Mild sigmoid diverticulosis without superimposed acute inflammatory change. Normal appendix Electronically  Signed   By: AFidela SalisburyM.D.   On: 02/10/2022 22:22    Procedures Procedures    Medications Ordered in ED Medications  iohexol (OMNIPAQUE) 300 MG/ML solution 100 mL (100 mLs Intravenous Contrast Given 02/10/22 2155)  morphine (PF) 4 MG/ML injection 4 mg (4 mg Intravenous Given 02/10/22 2148)    ED Course/ Medical Decision Making/ A&P                           Medical Decision Making Amount and/or Complexity of Data Reviewed External Data Reviewed: notes. Labs: ordered. Decision-making details documented in ED Course. Radiology: ordered and independent interpretation performed. Decision-making details documented in ED Course.  Risk Prescription drug management.   89y[place] with history of hyperlipidemia presents with concern for RLQ abdominal pain.   DDx includes appendicitis, pancreatitis, cholecystitis, pyelonephritis, nephrolithiasis, diverticulitis, intraabdominal abscess.   Labs with leukocytosis. No pancreatitis, hepatitis. UA without signs of infection. CT abdomen pelvis without acute intraabdominal pathology.   No RUQ tenderness to suggest cholecystitis or cholelithiasis.  Do not  see emergent pathology to require surgery or hospitalization at this time.  Recommend continued supportive care. Patient discharged in stable condition with understanding of reasons to return.           Final Clinical Impression(s) / ED Diagnoses Final diagnoses:  Right lower quadrant abdominal pain    Rx / DC Orders ED Discharge Orders          Ordered    dicyclomine (BENTYL) 20 MG tablet  2 times daily PRN        02/10/22 2245    ondansetron (ZOFRAN-ODT) 4 MG disintegrating tablet  Every 8 hours PRN        02/10/22 2245              Gareth Morgan, MD 02/11/22 1221

## 2022-02-10 NOTE — ED Triage Notes (Signed)
RLQ pain radiating into the back with N/V for greater than 7 hours.

## 2022-07-30 IMAGING — CT CT ABD-PELV W/ CM
2 of 4 series · 16 of 46 positions shown, 18 images · IV contrast (Omnipaque)
Comparison: 06/09/2015

CLINICAL DATA: Right lower quadrant abdominal pain, nausea,
vomiting

EXAM:
CT ABDOMEN AND PELVIS WITH CONTRAST
TECHNIQUE: Multidetector CT imaging of the abdomen and pelvis was performed
using the standard protocol following bolus administration of
intravenous contrast.

[Series 2: axial st · axial · 0.82mm/px · z∈[-469,-9]mm · 13 of 100 slices shown, 15 images]
[im 4/100  soft-tissue]
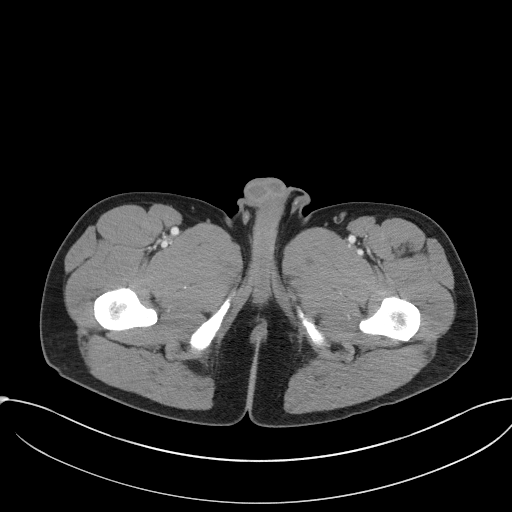
[im 4/100  bone]
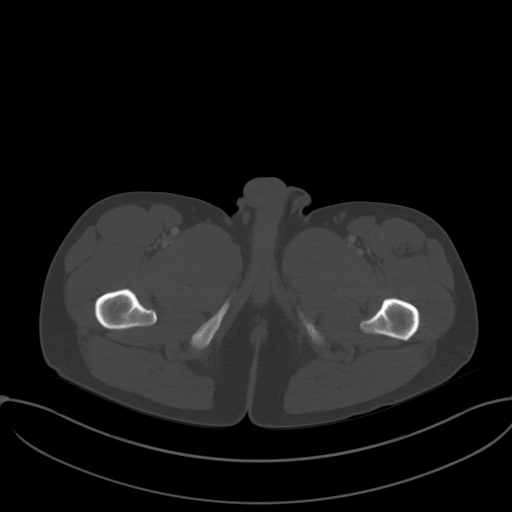
[im 12/100  soft-tissue]
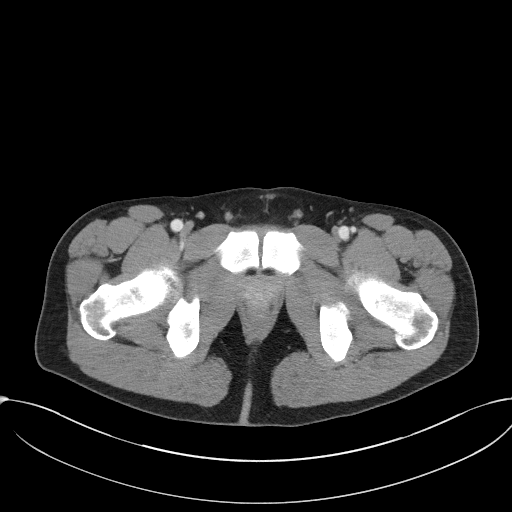
[im 20/100  soft-tissue]
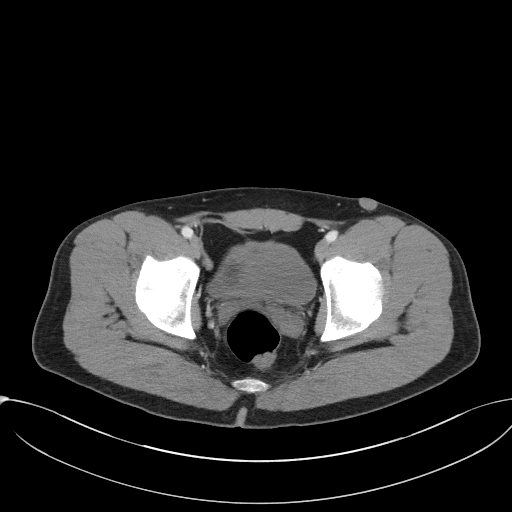
[im 27/100  soft-tissue]
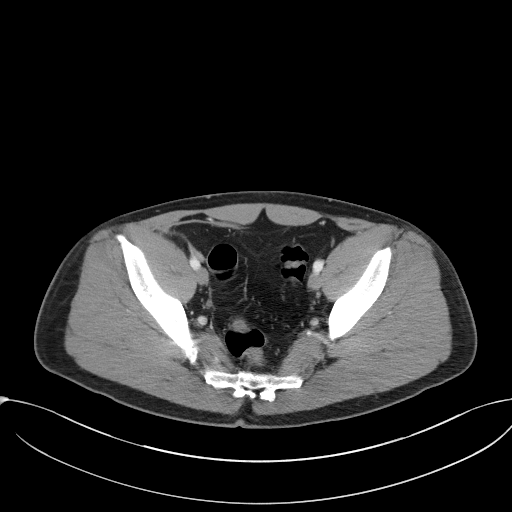
[im 35/100  soft-tissue]
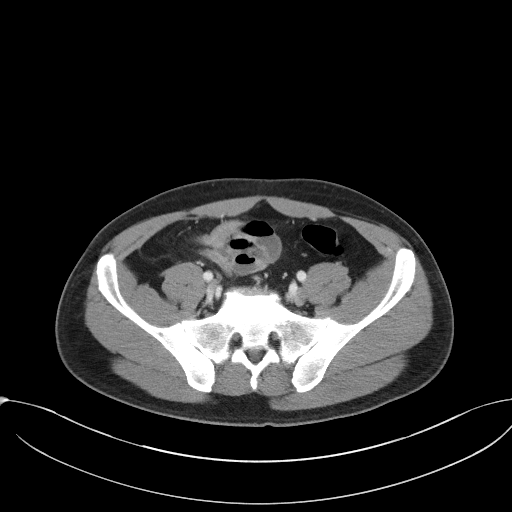
[im 42/100  soft-tissue]
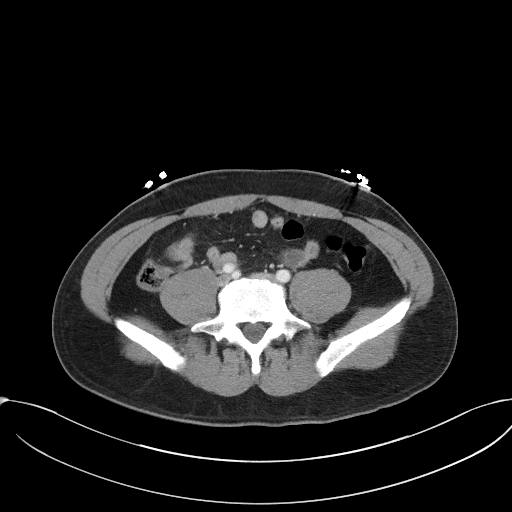
[im 50/100  soft-tissue]
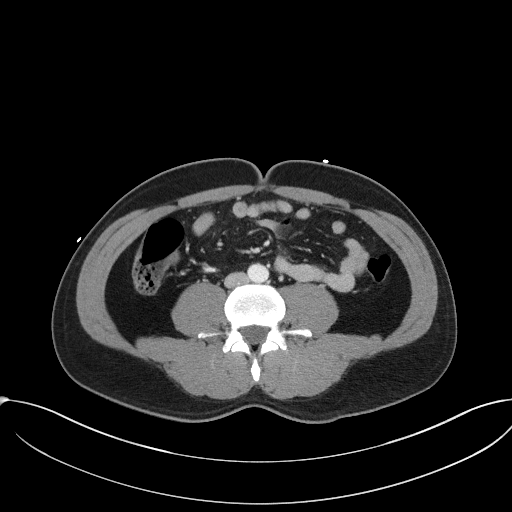
[im 58/100  soft-tissue]
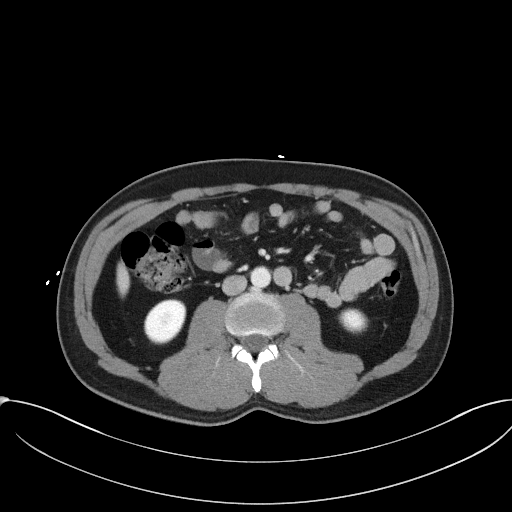
[im 65/100  soft-tissue]
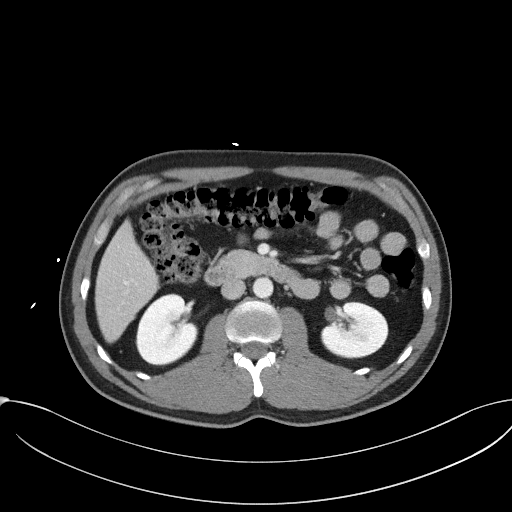
[im 65/100  bone]
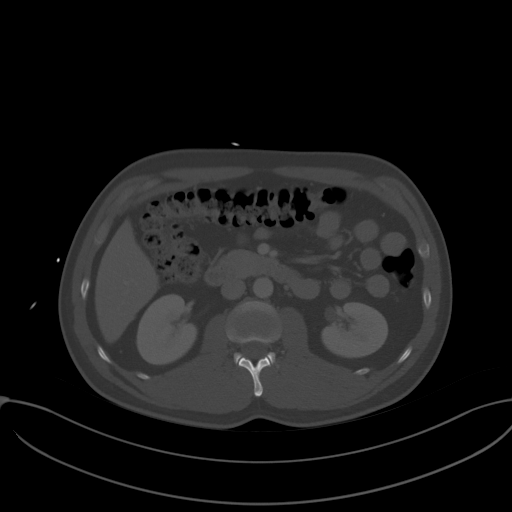
[im 73/100  soft-tissue]
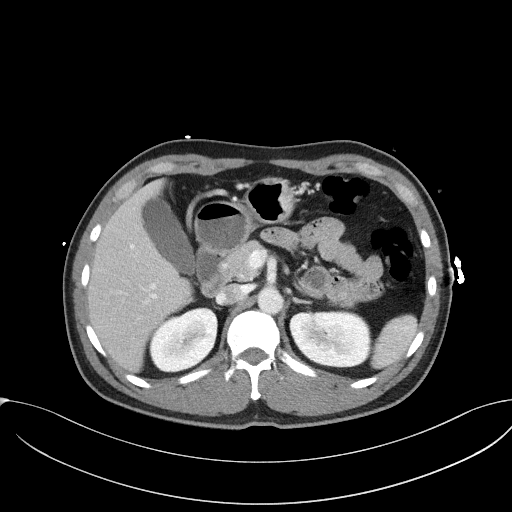
[im 80/100  soft-tissue]
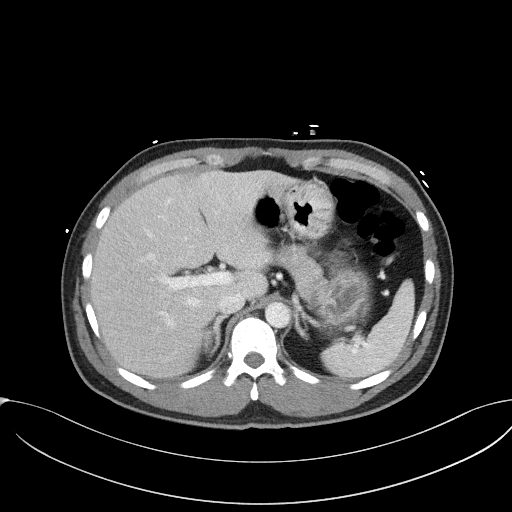
[im 88/100  soft-tissue]
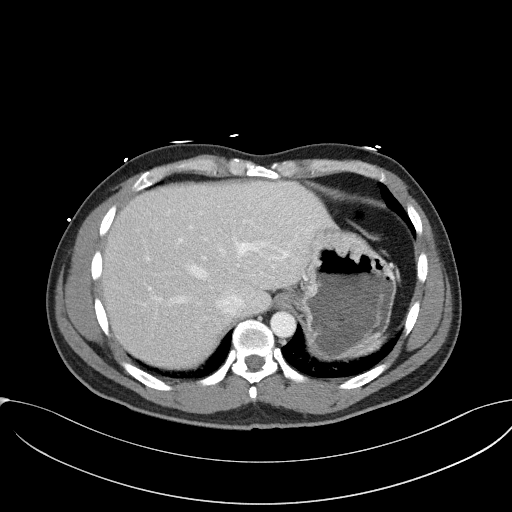
[im 96/100  soft-tissue]
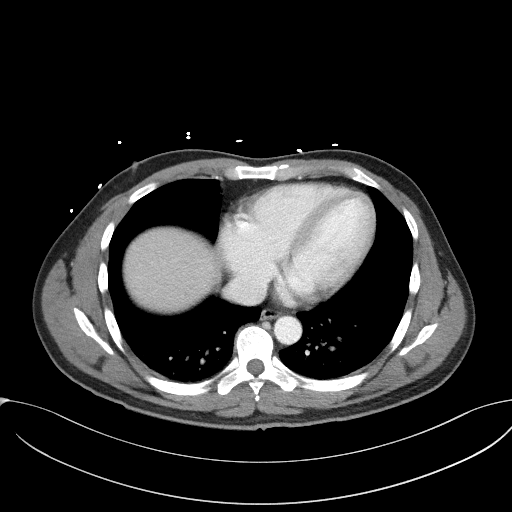

[Series 5: coronal st · coronal · 0.84mm/px · 3 of 100 slices shown]
[im 34/100  soft-tissue]
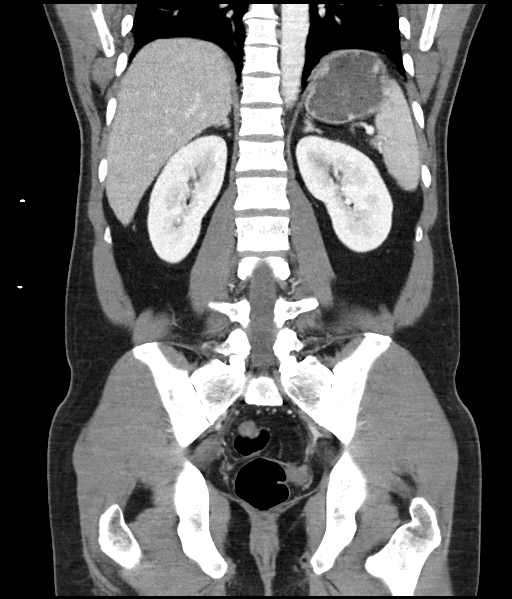
[im 45/100  soft-tissue]
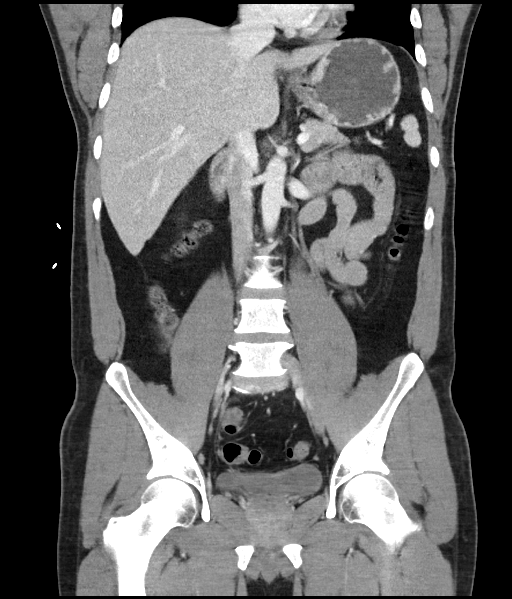
[im 56/100  soft-tissue]
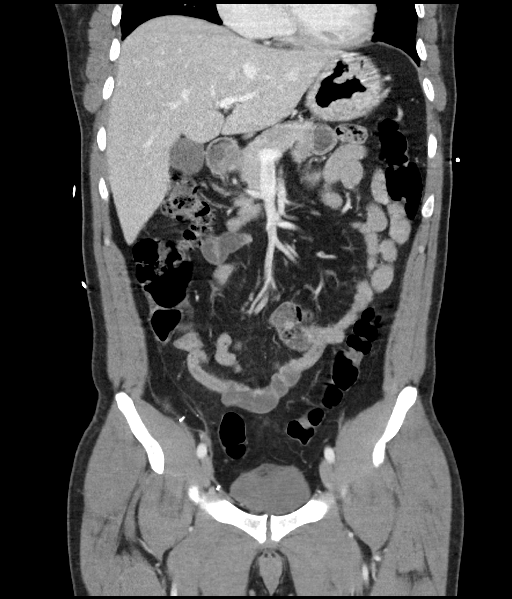

[16 of 46 positions shown; findings below may reference images not displayed]

RADIATION DOSE REDUCTION: This exam was performed according to the
departmental dose-optimization program which includes automated
exposure control, adjustment of the mA and/or kV according to
patient size and/or use of iterative reconstruction technique.

CONTRAST:  100mL OMNIPAQUE IOHEXOL 300 MG/ML  SOLN
FINDINGS: Lower chest: No acute abnormality.

Hepatobiliary: No focal liver abnormality is seen. No gallstones,
gallbladder wall thickening, or biliary dilatation.

Pancreas: Unremarkable

Spleen: Unremarkable

Adrenals/Urinary Tract: Adrenal glands are unremarkable. Kidneys are
normal, without renal calculi, focal lesion, or hydronephrosis.
Bladder is unremarkable.

Stomach/Bowel: Mild sigmoid diverticulosis. The stomach, small
bowel, and large bowel are otherwise unremarkable. Appendix normal.
No free intraperitoneal gas or fluid.

Vascular/Lymphatic: No significant vascular findings are present. No
enlarged abdominal or pelvic lymph nodes.

Reproductive: Prostate is unremarkable.

Other: Right inguinal hernia repair with mesh has been performed. No
recurrent abdominal wall hernia.

Musculoskeletal: No acute bone abnormality. No lytic or blastic bone
lesion.
IMPRESSION: No acute intra-abdominal pathology identified. No definite
radiographic explanation for the patient's reported symptoms.

Status post right inguinal hernia repair with mesh. No recurrent
hernia identified.

Mild sigmoid diverticulosis without superimposed acute inflammatory
change.

Normal appendix

## 2024-08-27 ENCOUNTER — Encounter (HOSPITAL_COMMUNITY): Payer: Self-pay | Admitting: General Surgery
